# Patient Record
Sex: Male | Born: 1950
Health system: Southern US, Community
[De-identification: ages and names within clinical notes are randomized; demographics above are authoritative.]

## PROBLEM LIST (undated history)

## (undated) DIAGNOSIS — K08109 Complete loss of teeth, unspecified cause, unspecified class: Secondary | ICD-10-CM

## (undated) DIAGNOSIS — Z972 Presence of dental prosthetic device (complete) (partial): Secondary | ICD-10-CM

## (undated) DIAGNOSIS — M1990 Unspecified osteoarthritis, unspecified site: Secondary | ICD-10-CM

## (undated) DIAGNOSIS — C61 Malignant neoplasm of prostate: Secondary | ICD-10-CM

## (undated) DIAGNOSIS — Z87898 Personal history of other specified conditions: Secondary | ICD-10-CM

## (undated) DIAGNOSIS — R32 Unspecified urinary incontinence: Secondary | ICD-10-CM

## (undated) DIAGNOSIS — I2510 Atherosclerotic heart disease of native coronary artery without angina pectoris: Secondary | ICD-10-CM

## (undated) DIAGNOSIS — F17210 Nicotine dependence, cigarettes, uncomplicated: Secondary | ICD-10-CM

## (undated) DIAGNOSIS — C679 Malignant neoplasm of bladder, unspecified: Secondary | ICD-10-CM

## (undated) DIAGNOSIS — Z87820 Personal history of traumatic brain injury: Secondary | ICD-10-CM

## (undated) HISTORY — PX: CARDIAC CATHETERIZATION: SHX172

---

## 2000-09-23 ENCOUNTER — Inpatient Hospital Stay (HOSPITAL_COMMUNITY): Admission: EM | Admit: 2000-09-23 | Discharge: 2000-09-25 | Payer: Self-pay | Admitting: Emergency Medicine

## 2000-09-23 ENCOUNTER — Encounter: Payer: Self-pay | Admitting: Emergency Medicine

## 2000-10-04 ENCOUNTER — Encounter: Admission: RE | Admit: 2000-10-04 | Discharge: 2000-10-04 | Payer: Self-pay | Admitting: Internal Medicine

## 2000-11-09 ENCOUNTER — Ambulatory Visit (HOSPITAL_COMMUNITY): Admission: RE | Admit: 2000-11-09 | Discharge: 2000-11-09 | Payer: Self-pay | Admitting: Gastroenterology

## 2004-10-22 ENCOUNTER — Emergency Department (HOSPITAL_COMMUNITY): Admission: EM | Admit: 2004-10-22 | Discharge: 2004-10-22 | Payer: Self-pay | Admitting: Emergency Medicine

## 2008-11-30 ENCOUNTER — Ambulatory Visit: Payer: Self-pay | Admitting: Internal Medicine

## 2011-01-27 NOTE — Procedures (Signed)
[place]. [facility]  Patient:    Aaron Strong, Aaron Strong                         MRN: [id number] Proc. Date: 11/09/00 Attending:  Verlin Grills, M.D.                           Procedure Report  PROCEDURE:  Esophagogastroduodenoscopy.  REFERRING PHYSICIAN:  Dr. Susie Cassette, internal medicine outpatient clinic, [facility], [address], [place], [place] [address].  PROCEDURE INDICATION:  Mr. Aaron Strong (date of birth June 09, 1951) is a 60 year old male with atypical chest pain.  His cardiac catheterization performed September 25, 2000, was unremarkable.  Aaron Strong was started on Protonix and has had no further episodes of chest pain.  I discussed with Aaron Strong the complications associated with esophagogastroduodenoscopy, including intestinal bleeding and intestinal perforation.  Aaron Strong has signed the operative permit.  ENDOSCOPIST:  Verlin Grills, M.D.  PREMEDICATION:  Fentanyl 50 mcg, Versed 10 mg.  ENDOSCOPE:  Olympus gastroscope.  DESCRIPTION OF PROCEDURE:  After obtaining informed consent, the patient was placed in the left lateral decubitus position.  I administered intravenous fentanyl and intravenous Versed to achieve conscious sedation for the procedure.  The patients blood pressure, oxygen saturation, and cardiac rhythm were monitored throughout the procedure and documented in the medical record.  The Olympus gastroscope was placed through the posterior hypopharynx into the proximal esophagus without difficulty.  The hypopharynx and larynx appeared normal.  I did not visualize the vocal cords.  Esophagoscopy:  The proximal, mid, and lower segments of the esophagus appeared normal.  The squamocolumnar junction is noted at approximately 37 cm from the incisor teeth.  Endoscopically there is no evidence for the presence of erosive esophagitis, Barretts esophagus, esophageal ulcers, or  esophageal mucosal scarring.  Gastroscopy:  Retroflex view of the gastric cardia and fundus was normal.  The gastric body, antrum, and pylorus appeared normal.  Duodenoscopy:  The duodenal bulb and descending duodenum appeared normal.  ASSESSMENT:  Normal esophagogastroduodenoscopy. DD:  11/09/00 TD:  11/09/00 Job: [id number]

## 2011-01-27 NOTE — Discharge Summary (Signed)
[place]. [facility]  Patient:    Aaron Strong, Aaron Strong                       MRN: [id number] Adm. Date:  [id number] Disc. Date: [id number] Attending:  Farley Ly Dictator:   Karlene Einstein, M.D.                           Discharge Summary  DISCHARGE DIAGNOSIS:  Chest pain, unclear etiology.  DISCHARGE MEDICATION:  Pepcid 20 mg p.o. q.h.s.  FOLLOW-UP APPOINTMENT:  Aaron Strong was instructed that the hospital internal medicine outpatient clinic would call him with a follow-up appointment sometime for the week following discharge.  PROCEDURES:  Cardiac catheterization which revealed noncritical coronary artery disease and normal left ventricular function with an ejection fraction of greater than 65%.  CONSULTATIONS:  [place] [facility], Nathen May, M.D., [facility], was consulted on September 23, 2000.  BRIEF HISTORY AND PHYSICAL:  CHIEF COMPLAINT:  Chest pain.  HISTORY OF PRESENT ILLNESS:  This is a 60 year old white male with a past medical history of questionable hypertension who presents with a week or so history of chest pain.  Aaron Strong complains of chest pain that started at the first of the year.  He complains of a substernal ache.  The only radiation he describes is bilateral hand numbness.  He states the pain is a 10/10 and sharp.  He reports the pain lasts anywhere from a few seconds to a minute and reports shortness of breath yesterday morning but not associated with the chest pain.  He also reports diaphoresis unrelated to the chest pain.  He denies nausea and vomiting.  He states nitroglycerin did improve his pain to an 8/10 and also helped to decreased chest tightness.  He did have a similar episode several years ago and was diagnosed with a bruised chest muscle.  He has unknown cholesterol status.  He denies known diabetes or hypertension.  He smokes four to five packs of cigarettes a day and has marked family medical history of  coronary artery disease.  He does report some recent exertional chest pain.  REVIEW OF SYSTEMS:  This is negative for double or blurry vision, hearing changes, syncope, bright red blood per rectum, diarrhea or constipation, dysuria, orthopnea, and PND.  PAST MEDICAL HISTORY: 1. Remote questionable history of hypertension (told by R.N. friend). 2. History of left broken clavicle.  ALLERGIES:  No known drug allergies.  MEDICATIONS:  None.  PAST SURGICAL HISTORY:  Question removal of a back cyst.  SOCIAL HISTORY:  Positive for tobacco four to five packs a day since 60 years of age. 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  He drinks approximately a 12-pack of alcohol per day.  He denies any current drug use but reports that he has used marijuana in the past.  FAMILY HISTORY:  This is significant for a mom who is alive at 72 with diabetes.  Aaron Strong father died with an unknown type of cancer at the age of 69.  He reports he has three brothers with diabetes and coronary artery disease.  One brother had his first MI at the age of 52 and has had multiple MIs since.  PHYSICAL EXAMINATION:  GENERAL APPEARANCE:  This is a pleasant white male in no acute distress.  VITAL SIGNS:  Temperature 97.3, blood pressure 174/83, on recheck blood pressure 137/70,  pulse 101 and on recheck 80, respiratory rate 20, saturations 98% on room air.  HEENT:  Pupils are equal, round and reactive to light, extraocular muscles intact, oropharynx clear without erythema or exudate.  NECK:  No cervical lymphadenopathy.  LUNGS:  Clear to auscultation bilaterally, no wheezing, rhonchi or crackles.  CARDIOVASCULAR:  Regular rate and rhythm without murmurs, rubs, or gallops. No JVD, no carotid bruits bilaterally.  ABDOMEN:  Bowel sounds are present.  Abdomen is soft, nondistended, nontender and no hepatosplenomegaly.  EXTREMITIES:  There is no edema in lower  extremities bilaterally, 2+ posterior tibial pulses bilaterally.  NEUROLOGICAL:  Cranial nerves II-XII grossly intact, no focal deficits, moves all extremities well.  RECTAL:  Heme-negative.  LABS ON ADMISSION:  CBC showed white count 12.7, hemoglobin 16.1, platelets 206, MCV 96.3, ANC 9.8. BMP showed sodium 138, potassium 4.1, chloride 105, bicarb 31, BUN 12, creatinine 1.2, glucose 106.  CK 101, MB 0.7, troponin I less than 0.01.  EKG showed normal sinus rhythm with peak T-waves in II, aVF, V3 and V4.  Chest x-ray showed no acute disease, no effusion or infiltrate.  HOSPITAL COURSE BY PROBLEMS:  #1 - ATYPICAL CHEST PAIN:  Aaron Strong did give a history of that was atypical for cardiac chest pain.  However, given his significant risk factors including large quantities of cigarette use and significant family history, the decision was made to begin treatment with aspirin, Lopressor, heparin drip, and nitroglycerin drip.  In addition, cardiology was consulted and agreed with plan to proceed with treatment; in addition, recommended cardiac catheterization.  Serial enzymes and EKGs were followed during hospitalization.  Enzymes x 3 were negative.  EKG on admission was noted to be significant for peak T-waves in II, III, aVF, V3 and V4.  EKG the following morning was negative with the exception of continued increase in T-waves in V2, V3 and V4.  After initial presentation, Aaron Strong was without chest pain or shortness of breath throughout his hospitalization.  Cardiac catheterization done on September 25, 2000, revealed noncritical coronary artery disease and good left ventricular function.  Therefore, it was felt that this pain was likely noncardiac and Aaron Strong was discharged home with a prescription for Pepcid in the event that this was in fact GI etiology.  A cholesterol panel was checked which revealed total cholesterol of 187, triglycerides 95, HDL 64 and LDL of 99.  With an LDL of  less than 100, even in this patient with coronary artery disease and tobacco abuse, a [place]  antihyperlipidemic therapy was not initiated at this time.  With these risk factors, this will likely need follow-up as an outpatient and possible initiation of therapy if cholesterol levels change.  #2 - TOBACCO/ALCOHOL ABUSE:  It is possible that both tobacco and alcohol abuse could be contributing to the present symptoms of Aaron Strong.  Care management was asked to see him and counsel him regarding community resources for both alcohol and tobacco cessation.  He was encouraged to participate in both of these activities and counseled regarding the potential damages to his health.  DISCHARGE LABS:  CBC showed white count 10.6, hemoglobin 15.0, hematocrit 14.4, platelets 215.  BMP showed sodium 140, potassium 3.9, chloride 105, bicarb 29, BUN 8, creatinine 0.8, glucose 95, calcium 9.0.  PT 13.2, INR 1.0, PTT 149.  DISPOSITION:  Aaron Strong was discharged to home in good condition.  Dictated by Susie Cassette, M.D., intern. DD:  10/02/00 TD:  10/03/00 Job: [id number]

## 2011-01-27 NOTE — Cardiovascular Report (Signed)
[place]. [facility]  Patient:    Aaron Strong, Aaron Strong                       MRN: [id number] Proc. Date: 09/25/00 Adm. Date:  [id number] Attending:  Farley Ly CC:         Nathen May, M.D., [facility]  Farley Ly, M.D.   Cardiac Catheterization  PROCEDURES PERFORMED: 1. Left heart catheterization. 2. Left ventriculogram. 3. Selective coronary angiography. 4. Perclose, right femoral artery.  DIAGNOSES: 1. Mild coronary artery disease by angiogram. 2. Normal left ventricular systolic function.  HISTORY:  Mr. Aaron Strong is a 60 year old white male with multiple cardiac risk factors who presents with substernal chest discomfort.  The patient was admitted to the hospital and subsequently ruled out for acute myocardial infarction by enzymes.  He presents now for left heart catheterization.  DESCRIPTION OF PROCEDURE:  Informed consent was obtained.  The patient was brought to the catheterization lab.  A 6-French sheath was placed in the right femoral artery.  Selective angiography was then performed using preformed 6-French Judkins catheters.  A left ventriculogram was performed using power injections of contrast.  At the termination of the case, a Perclose suture closure device was deployed in the right femoral artery until adequate hemostasis was achieved.  The patient tolerated the procedure well and was transferred to the floor in stable condition.  Findings are as follows:  FINDINGS: 1. Left main trunk:  Large caliber vessel, mild irregularities. 2. LAD:  This is a medium caliber vessel that provides 2 diagonal branches.    There is moderate diffuse disease along the LAD with a focal narrowing of    30-40% in the mid section. 3. Left circumflex artery:  Large caliber vessel, is codominant.   Provides    2 marginal branches in the mid section and 3 small marginal branches    distally.  There is diffuse disease in the left circumflex of  not greater    than 30%. 4. Right coronary artery:  This is a small caliber vessel that provides an RV    marginal branch in the mid section and a trivial posterior descending    artery distally.  There is a focal narrowing of 60% in the mid RCA prior to    the takeoff of the RV marginal branch.  The remainder of the vessel has    mild diffuse disease.  LV:  Normal end systolic and end diastolic dimensions.  Overall left ventricular function is well preserved.  Ejection fraction of greater than 65%.  No mitral regurgitation.  LV pressure is 108/5.  Aortic is 108/60.  LVEDP equals 14.  ASSESSMENT AND PLAN:  Mr. Aaron Strong is a 60 year old gentleman with noncritical coronary artery disease and normal LV function by angiogram.  Further medical management and attention to risk factor modification should be pursued in regards to his coronary disease and other causes of chest pain investigated. DD:  09/25/00 TD:  09/25/00 Job: [id number]

## 2012-04-22 ENCOUNTER — Encounter: Payer: Self-pay | Admitting: Internal Medicine

## 2012-04-22 ENCOUNTER — Ambulatory Visit (INDEPENDENT_AMBULATORY_CARE_PROVIDER_SITE_OTHER): Payer: BC Managed Care – PPO | Admitting: Internal Medicine

## 2012-04-22 VITALS — BP 160/90 | HR 88 | Temp 98.1°F | Ht 64.5 in | Wt 153.0 lb

## 2012-04-22 DIAGNOSIS — Z23 Encounter for immunization: Secondary | ICD-10-CM

## 2012-04-22 DIAGNOSIS — F1010 Alcohol abuse, uncomplicated: Secondary | ICD-10-CM

## 2012-04-22 DIAGNOSIS — L723 Sebaceous cyst: Secondary | ICD-10-CM

## 2012-04-22 MED ORDER — TETANUS-DIPHTH-ACELL PERTUSSIS 5-2.5-18.5 LF-MCG/0.5 IM SUSP: 0.5000 mL | Freq: Once | INTRAMUSCULAR | Status: DC

## 2012-04-22 NOTE — Patient Instructions (Addendum)
Clean back of neck with peroxide twice daily. Return in 48 hours for recheck. Take antibiotics as prescribed.

## 2012-04-22 NOTE — Progress Notes (Signed)
  Subjective:    Patient ID: Aaron Strong, male    DOB: December 08, 1950, 61 y.o.   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 Couple of weeks ago he noticed a nodule on the back of his neck. This past weekend it drained some pus like material. In today to have it evaluated.    Review of Systems     Objective:   Physical Exam   1.5 cm raised nodule back of neck. Not currently draining. Not red or significantly tender. After sterile prep and drape and local anesthesia with 1% Xylocaine. Area was incised with a scalpel. No drainage noted. Iodoform gauze was inserted. Patient tolerated procedure well without complications.        Assessment & Plan:  Infected sebaceous cyst  Plan: Patient placed on doxycycline 100 mg twice daily for 7 days. History turned 48 hours to have drain removed. Clean area with peroxide twice daily. Tetanus immunization update given.

## 2012-04-24 ENCOUNTER — Encounter: Payer: Self-pay | Admitting: Internal Medicine

## 2012-04-24 ENCOUNTER — Ambulatory Visit (INDEPENDENT_AMBULATORY_CARE_PROVIDER_SITE_OTHER): Payer: BC Managed Care – PPO | Admitting: Internal Medicine

## 2012-04-24 VITALS — BP 154/88 | HR 92 | Temp 99.0°F | Ht 64.5 in | Wt 153.0 lb

## 2012-04-24 DIAGNOSIS — L723 Sebaceous cyst: Secondary | ICD-10-CM

## 2012-05-11 NOTE — Patient Instructions (Addendum)
Complete course of antibiotics.  Return as needed.  

## 2012-05-11 NOTE — Progress Notes (Signed)
  Subjective:    Patient ID: Aaron Strong, male    DOB: 1951/02/03, 61 y.o.   MRN: [id number]  HPI 61 year old white male with history of alcohol abuse in today for recheck on incision and drainage of infected cyst back of neck. He's been taking care of it fairly well. Currently no evidence of infection. Iodoform gauze drain has come out. Taking antibiotics as directed.    Review of Systems     Objective:   Physical Exam wound is open but clean without significant drainage.       Assessment & Plan:  Infected cyst back of neck-healing well  Plan: If patient has recurrence, he will need to be referred to general surgeon for excision. Otherwise complete ten-day course of doxycycline.

## 2013-09-30 ENCOUNTER — Other Ambulatory Visit: Payer: No Typology Code available for payment source | Admitting: Internal Medicine

## 2013-09-30 DIAGNOSIS — Z130 Encounter for screening for diseases of the blood and blood-forming organs and certain disorders involving the immune mechanism: Secondary | ICD-10-CM

## 2013-09-30 DIAGNOSIS — Z125 Encounter for screening for malignant neoplasm of prostate: Secondary | ICD-10-CM

## 2013-09-30 DIAGNOSIS — Z0000 Encounter for general adult medical examination without abnormal findings: Secondary | ICD-10-CM

## 2013-09-30 DIAGNOSIS — Z13220 Encounter for screening for lipoid disorders: Secondary | ICD-10-CM

## 2013-09-30 LAB — CBC WITH DIFFERENTIAL/PLATELET
Basophils Absolute: 0 10*3/uL (ref 0.0–0.1)
Basophils Relative: 0 % (ref 0–1)
Eosinophils Absolute: 0 10*3/uL (ref 0.0–0.7)
Eosinophils Relative: 1 % (ref 0–5)
HCT: 49.5 % (ref 39.0–52.0)
Hemoglobin: 17.6 g/dL — ABNORMAL HIGH (ref 13.0–17.0)
Lymphocytes Relative: 23 % (ref 12–46)
Lymphs Abs: 2 10*3/uL (ref 0.7–4.0)
MCH: 33.7 pg (ref 26.0–34.0)
MCHC: 35.6 g/dL (ref 30.0–36.0)
MCV: 94.6 fL (ref 78.0–100.0)
Monocytes Absolute: 1.1 10*3/uL — ABNORMAL HIGH (ref 0.1–1.0)
Monocytes Relative: 13 % — ABNORMAL HIGH (ref 3–12)
Neutro Abs: 5.4 10*3/uL (ref 1.7–7.7)
Neutrophils Relative %: 63 % (ref 43–77)
Platelets: 275 10*3/uL (ref 150–400)
RBC: 5.23 MIL/uL (ref 4.22–5.81)
RDW: 13.7 % (ref 11.5–15.5)
WBC: 8.5 10*3/uL (ref 4.0–10.5)

## 2013-09-30 LAB — LIPID PANEL
Cholesterol: 172 mg/dL (ref 0–200)
HDL: 81 mg/dL (ref >39)
LDL Cholesterol: 79 mg/dL (ref 0–99)
Total CHOL/HDL Ratio: 2.1 Ratio
Triglycerides: 61 mg/dL (ref <150)
VLDL: 12 mg/dL (ref 0–40)

## 2013-09-30 LAB — COMPREHENSIVE METABOLIC PANEL
ALT: 10 U/L (ref 0–53)
AST: 26 U/L (ref 0–37)
Albumin: 4.4 g/dL (ref 3.5–5.2)
Alkaline Phosphatase: 95 U/L (ref 39–117)
BUN: 5 mg/dL — ABNORMAL LOW (ref 6–23)
CO2: 31 mEq/L (ref 19–32)
Calcium: 9.3 mg/dL (ref 8.4–10.5)
Chloride: 97 mEq/L (ref 96–112)
Creat: 0.6 mg/dL (ref 0.50–1.35)
Glucose, Bld: 81 mg/dL (ref 70–99)
Potassium: 4.5 mEq/L (ref 3.5–5.3)
Sodium: 135 mEq/L (ref 135–145)
Total Bilirubin: 0.6 mg/dL (ref 0.3–1.2)
Total Protein: 7.5 g/dL (ref 6.0–8.3)

## 2013-10-01 LAB — PSA: PSA: 25.86 ng/mL — ABNORMAL HIGH (ref <=4.00)

## 2013-10-07 ENCOUNTER — Ambulatory Visit
Admission: RE | Admit: 2013-10-07 | Discharge: 2013-10-07 | Disposition: A | Discharge status: Home / Self Care | Payer: No Typology Code available for payment source | Source: Ambulatory Visit | Attending: Internal Medicine | Admitting: Internal Medicine

## 2013-10-07 ENCOUNTER — Ambulatory Visit (INDEPENDENT_AMBULATORY_CARE_PROVIDER_SITE_OTHER): Payer: No Typology Code available for payment source | Admitting: Internal Medicine

## 2013-10-07 VITALS — BP 156/94 | HR 88 | Temp 98.6°F | Ht 65.5 in | Wt 143.5 lb

## 2013-10-07 DIAGNOSIS — R972 Elevated prostate specific antigen [PSA]: Secondary | ICD-10-CM

## 2013-10-07 DIAGNOSIS — Z8709 Personal history of other diseases of the respiratory system: Secondary | ICD-10-CM | POA: Insufficient documentation

## 2013-10-07 DIAGNOSIS — Z0000 Encounter for general adult medical examination without abnormal findings: Secondary | ICD-10-CM

## 2013-10-07 DIAGNOSIS — R079 Chest pain, unspecified: Secondary | ICD-10-CM

## 2013-10-07 DIAGNOSIS — IMO0001 Reserved for inherently not codable concepts without codable children: Secondary | ICD-10-CM | POA: Insufficient documentation

## 2013-10-07 DIAGNOSIS — F17200 Nicotine dependence, unspecified, uncomplicated: Secondary | ICD-10-CM | POA: Insufficient documentation

## 2013-10-07 DIAGNOSIS — Z23 Encounter for immunization: Secondary | ICD-10-CM

## 2013-10-07 LAB — POCT URINALYSIS DIPSTICK
Bilirubin, UA: NEGATIVE
Blood, UA: NEGATIVE
Glucose, UA: NEGATIVE
Ketones, UA: NEGATIVE
Leukocytes, UA: NEGATIVE
Nitrite, UA: NEGATIVE
Protein, UA: NEGATIVE
Spec Grav, UA: <=1.005
Urobilinogen, UA: NEGATIVE
pH, UA: 7

## 2013-10-07 LAB — HEMOCCULT GUIAC POC 1CARD (OFFICE): FECAL OCCULT BLD: NEGATIVE

## 2013-10-07 MED ORDER — PNEUMOCOCCAL VAC POLYVALENT 25 MCG/0.5ML IJ INJ: 0.5000 mL | INJECTION | INTRAMUSCULAR | Status: DC

## 2013-10-07 NOTE — Patient Instructions (Addendum)
Appt with urologist re elevated PSA. To have CXR.  Pneumovax given. Hemoccult x 1 negative.

## 2013-10-08 ENCOUNTER — Encounter: Payer: Self-pay | Admitting: Internal Medicine

## 2013-10-08 NOTE — Progress Notes (Signed)
Subjective:    Patient ID: Aaron Strong, male    DOB: 11-11-1950, 63 y.o.   MRN: [id number]  HPI 63 year old White male for health maintenance and they wish of medical issues. Patient has been a heavy smoker for many years. Currently smoking 2-1/2 packs per day. At one point decrease to one pack per day. Has smoked since he was 63 years old. Recently had some chest pain while raking leaves. He took a break and went back to raking leaves and chest pain did not recur. He had no sweating, nausea or vomiting with the chest pain. He consumes about 18 beers nightly. 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 He completed the 11th grade. In 1979 he was engaged but never married. Fiance broken engagement. He lives alone. He does not drive. He walks to work.   History of infected sebaceous cyst posterior neck treated here August 2013.   Family history: 2 brothers died of heart attacks at ages 62 and 106. Father died in his 79s of cancer.  Remote history of right clavicle fracture. Had CT scan of brain in 2006 after a fall which was negative for skull fracture. Last chest x-ray in 2002 showed no active disease.      Review of Systems  Constitutional: Negative.   HENT: Negative.   Eyes: Negative.   Respiratory:       History of heavy smoking  Cardiovascular: Positive for chest pain.       One episode of chest pain after raking leaves for 15 minutes. No recurrence.  Endocrine: Negative.   Genitourinary: Negative.   Allergic/Immunologic: Negative.   Neurological: Negative.   Hematological: Negative.   Psychiatric/Behavioral:   History of heavy alcohol intake       Objective:   Physical Exam  Vitals reviewed. Constitutional: He is oriented to person, place, and time. He appears well-developed and well-nourished. No distress.  HENT:  Head: Normocephalic and atraumatic.  Right Ear: External ear normal.  Left Ear: External ear normal.  Mouth/Throat: Oropharynx is clear and moist. No oropharyngeal exudate.  Eyes: Conjunctivae and EOM are normal. Pupils are equal, round, and reactive to light. Right eye exhibits no discharge. Left eye exhibits no discharge. No scleral icterus.  Neck: Normal range of motion. Neck supple. No thyromegaly present.  No JVD. No carotid bruits  Cardiovascular: Normal rate, regular rhythm, normal heart sounds and intact distal pulses.   No murmur heard. Posterior tibial pulses present. Dorsalis pedis pulses absent  Pulmonary/Chest: Effort normal and breath sounds normal. No respiratory distress. He has no wheezes. He has no rales. He exhibits no tenderness.  Abdominal: Soft. Bowel sounds are normal. He exhibits no distension and no mass. There is no tenderness. There is no rebound and no guarding.  Genitourinary:  Prostate enlarged and somewhat firm but no distinct nodules. Stool guaiac negative x1  Musculoskeletal: Normal range of motion. He exhibits no edema.  Lymphadenopathy:    He has no cervical adenopathy.  Neurological: He is alert and oriented to person, place, and time. He has normal reflexes. He displays normal reflexes.  No cranial nerve deficit. Coordination normal.  Slight tremor  Skin: Skin is warm and dry. He is not diaphoretic.  Psychiatric: He has a normal mood and affect. His behavior is normal. Judgment and thought content normal.          Assessment & Plan:  Chest pain-does not sound like cardiac chest pain in that although it occurred with exertion. It was not recurrent. He had no associated diaphoresis nausea or vomiting. EKG is within normal  limits  COPD-pulse oximetry within normal limits. Currently asymptomatic.  Elevated PSA  History of alcohol abuse  History of smoking  Plan: Needs to see a urologist regarding elevated PSA of 25.86.  No other PSA results on file.  He is asymptomatic. No symptoms of BPH. He will receive Pneumovax immunization today. We will give Zostavax vaccine in a later date. He declines influenza immunization. Hemoccult card x1 today in the office was negative. Declines colonoscopy. Chest x-ray will be obtained.

## 2013-10-17 ENCOUNTER — Ambulatory Visit: Payer: No Typology Code available for payment source | Admitting: Internal Medicine

## 2013-10-21 ENCOUNTER — Ambulatory Visit (INDEPENDENT_AMBULATORY_CARE_PROVIDER_SITE_OTHER): Payer: No Typology Code available for payment source | Admitting: Internal Medicine

## 2013-10-21 DIAGNOSIS — Z2911 Encounter for prophylactic immunotherapy for respiratory syncytial virus (RSV): Secondary | ICD-10-CM

## 2013-10-21 DIAGNOSIS — Z23 Encounter for immunization: Secondary | ICD-10-CM

## 2013-12-16 ENCOUNTER — Other Ambulatory Visit: Payer: Self-pay | Admitting: Urology

## 2013-12-16 DIAGNOSIS — C61 Malignant neoplasm of prostate: Secondary | ICD-10-CM

## 2013-12-26 ENCOUNTER — Ambulatory Visit (HOSPITAL_COMMUNITY)
Admission: RE | Admit: 2013-12-26 | Discharge: 2013-12-26 | Disposition: A | Discharge status: Home / Self Care | Payer: No Typology Code available for payment source | Source: Ambulatory Visit | Attending: Urology | Admitting: Urology

## 2013-12-26 ENCOUNTER — Encounter (HOSPITAL_COMMUNITY): Payer: No Typology Code available for payment source

## 2013-12-26 DIAGNOSIS — M899 Disorder of bone, unspecified: Secondary | ICD-10-CM | POA: Insufficient documentation

## 2013-12-26 DIAGNOSIS — M949 Disorder of cartilage, unspecified: Secondary | ICD-10-CM

## 2013-12-26 DIAGNOSIS — C61 Malignant neoplasm of prostate: Secondary | ICD-10-CM | POA: Insufficient documentation

## 2013-12-26 MED ORDER — TECHNETIUM TC 99M MEDRONATE IV KIT
26.1000 | PACK | Freq: Once | INTRAVENOUS | Status: AC | PRN
  Administered 2013-12-26: 26.1 via INTRAVENOUS

## 2014-01-14 ENCOUNTER — Other Ambulatory Visit: Payer: Self-pay | Admitting: Urology

## 2014-02-03 ENCOUNTER — Ambulatory Visit (INDEPENDENT_AMBULATORY_CARE_PROVIDER_SITE_OTHER): Payer: BC Managed Care – PPO | Admitting: Cardiology

## 2014-02-03 ENCOUNTER — Encounter: Payer: Self-pay | Admitting: Cardiology

## 2014-02-03 VITALS — BP 186/100 | HR 98 | Ht 65.5 in | Wt 146.8 lb

## 2014-02-03 DIAGNOSIS — R079 Chest pain, unspecified: Secondary | ICD-10-CM

## 2014-02-03 DIAGNOSIS — Z01818 Encounter for other preprocedural examination: Secondary | ICD-10-CM

## 2014-02-03 DIAGNOSIS — C61 Malignant neoplasm of prostate: Secondary | ICD-10-CM

## 2014-02-03 NOTE — Patient Instructions (Signed)
Your physician recommends that you continue on your current medications as directed. Please refer to the Current Medication list given to you today.  Your physician has requested that you have an echocardiogram. Echocardiography is a painless test that uses sound waves to create images of your heart. It provides your doctor with information about the size and shape of your heart and how well your heart's chambers and valves are working. This procedure takes approximately one hour. There are no restrictions for this procedure.  Your physician has requested that you have en exercise stress myoview. For further information please visit [email or web address]. Please follow instruction sheet, as given.  Follow up as needed   

## 2014-02-03 NOTE — Progress Notes (Signed)
  [place], [place], [place]  [address] Phone: [phone] Fax:  [phone]  Date:  02/03/2014   ID:  Aaron Strong, DOB 12/11/1950, MRN [id number]  PCP:  [name], MD  Cardiologist:  Fransico Him, MD     History of Present Illness: This is a 63yo WM with a history of heavy tobacco use in the past and had some CP back in January while raking leaves.  He had no associated symptoms and it resolved after he rested.  Since then he has not had any further episodes of chest pain.  He also consumes 18 beers a day.  He has a strong family history of CAD with 2 brothers died of heart attacks at ages 49 and 41.  He is now referred for cardiac clearance for urologic surgery for prostate cancer.  He walks to work every morning about 3 blocks.  Wt Readings from Last 3 Encounters:  02/03/14 146 lb 12.8 oz (66.588 kg)  10/07/13 143 lb 8 oz (65.091 kg)  04/24/12 153 lb (69.4 kg)     History reviewed. No pertinent past medical history.  No current outpatient prescriptions on file.   Current Facility-Administered Medications  Medication Dose Route Frequency Provider Last Rate Last Dose  . pneumococcal 23 valent vaccine (PNU-IMMUNE) injection 0.5 mL  0.5 mL Intramuscular Tomorrow-1000 [name], MD      . TDaP Durwin Reges) injection 0.5 mL  0.5 mL Intramuscular Once [name], MD        Allergies:   No Known Allergies  Social History:  The patient  reports that he has been smoking Cigarettes.  He has been smoking about 2.00 packs per day. He has never used smokeless tobacco. He reports that he drinks about 75.6 ounces of alcohol per week. He reports that he does not use illicit drugs.   Family History:  The patient's family history includes Heart attack in his brother, brother, and father; Heart disease in his brother, brother, and father.   ROS:  Please see the history of present illness.      All other systems reviewed and negative.   PHYSICAL EXAM: VS:  BP 186/100   Pulse 98  Ht 5' 5.5" (1.664 m)  Wt 146 lb 12.8 oz (66.588 kg)  BMI 24.05 kg/m2 Well nourished, well developed, in no acute distress HEENT: normal LUNGS:  CTA bilaterally COR:  RRR with no M/R/G ABD:  Soft, NT, ND with no masses or bruits EXT:  No C/E/E, +2 PT pulses bilaterally  EKG:  NSR with no ST changes  ASSESSMENT/PLAN: 1.  Preoperative cardiac clearance for prostate surgery  2.  Chest pain - he had one episode several months ago with exertion with no recent episodes but has multiple CRF including strong family history of CAD at early age, signfiicant tobacco and ETOH abuse. - check Stress myoview and 2D echo to assess for ischemia and LVF  Followup with me PRN pending results of studies   Signed, Fransico Him, MD 02/03/2014 3:58 PM

## 2014-02-04 ENCOUNTER — Other Ambulatory Visit: Payer: Self-pay | Admitting: *Deleted

## 2014-02-04 DIAGNOSIS — R079 Chest pain, unspecified: Secondary | ICD-10-CM

## 2014-02-05 ENCOUNTER — Ambulatory Visit (HOSPITAL_COMMUNITY): Payer: BC Managed Care – PPO

## 2014-02-05 NOTE — Patient Instructions (Signed)
Aaron Strong  02/05/2014   Your procedure is scheduled on:  02/18/2014  0830am-12noon  Report to [facility].  Follow the Signs to [place] at    0630    am  Call this number if you have problems the morning of surgery: [phone]   Remember:   Do not eat food or drink liquids after midnight.   Take these medicines the morning of surgery with A SIP OF WATER:    Do not wear jewelry,   Do not wear lotions, powders, or perfumes.  . Men may shave face and neck.  Do not bring valuables to the hospital.  Contacts, dentures or bridgework may not be worn into surgery.  Leave suitcase in the car. After surgery it may be brought to your room.  For patients admitted to the hospital, checkout time is 11:00 AM the day of  discharge.          Please read over the following fact sheets that you were given:[facility] - Preparing for Surgery Before surgery, you can play an important role.  Because skin is not sterile, your skin needs to be as free of germs as possible.  You can reduce the number of germs on your skin by washing with CHG (chlorahexidine gluconate) soap before surgery.  CHG is an antiseptic cleaner which kills germs and bonds with the skin to continue killing germs even after washing. Please DO NOT use if you have an allergy to CHG or antibacterial soaps.  If your skin becomes reddened/irritated stop using the CHG and inform your nurse when you arrive at Short Stay. Do not shave (including legs and underarms) for at least 48 hours prior to the first CHG shower.  You may shave your face/neck. Please follow these instructions carefully:  1.  Shower with CHG Soap the night before surgery and the  morning of Surgery.  2.  If you choose to wash your hair, wash your hair first as usual with your  normal  shampoo.  3.  After you shampoo, rinse your hair and body thoroughly to remove the  shampoo.                           4.  Use CHG as you would any other liquid soap.   You can apply chg directly  to the skin and wash                       Gently with a scrungie or clean washcloth.  5.  Apply the CHG Soap to your body ONLY FROM THE NECK DOWN.   Do not use on face/ open                           Wound or open sores. Avoid contact with eyes, ears mouth and genitals (private parts).                       Wash face,  Genitals (private parts) with your normal soap.             6.  Wash thoroughly, paying special attention to the area where your surgery  will be performed.  7.  Thoroughly rinse your body with warm water from the neck down.  8.  DO NOT shower/wash with your normal soap after using and rinsing off  the  CHG Soap.                9.  Pat yourself dry with a clean towel.            10.  Wear clean pajamas.            11.  Place clean sheets on your bed the night of your first shower and do not  sleep with pets. Day of Surgery : Do not apply any lotions/deodorants the morning of surgery.  Please wear clean clothes to the hospital/surgery center.  FAILURE TO FOLLOW THESE INSTRUCTIONS MAY RESULT IN THE CANCELLATION OF YOUR SURGERY PATIENT SIGNATURE_________________________________  NURSE SIGNATURE__________________________________  ________________________________________________________________________  WHAT IS A BLOOD TRANSFUSION? Blood Transfusion Information  A transfusion is the replacement of blood or some of its parts. Blood is made up of multiple cells which provide different functions.  Red blood cells carry oxygen and are used for blood loss replacement.  White blood cells fight against infection.  Platelets control bleeding.  Plasma helps clot blood.  Other blood products are available for specialized needs, such as hemophilia or other clotting disorders. BEFORE THE TRANSFUSION  Who gives blood for transfusions?   Healthy volunteers who are fully evaluated to make sure their blood is safe. This is blood bank blood. Transfusion  therapy is the safest it has ever been in the practice of medicine. Before blood is taken from a donor, a complete history is taken to make sure that person has no history of diseases nor engages in risky social behavior (examples are intravenous drug use or sexual activity with multiple partners). The donor's travel history is screened to minimize risk of transmitting infections, such as malaria. The donated blood is tested for signs of infectious diseases, such as HIV and hepatitis. The blood is then tested to be sure it is compatible with you in order to minimize the chance of a transfusion reaction. If you or a relative donates blood, this is often done in anticipation of surgery and is not appropriate for emergency situations. It takes many days to process the donated blood. RISKS AND COMPLICATIONS Although transfusion therapy is very safe and saves many lives, the main dangers of transfusion include:   Getting an infectious disease.  Developing a transfusion reaction. This is an allergic reaction to something in the blood you were given. Every precaution is taken to prevent this. The decision to have a blood transfusion has been considered carefully by your caregiver before blood is given. Blood is not given unless the benefits outweigh the risks. AFTER THE TRANSFUSION  Right after receiving a blood transfusion, you will usually feel much better and more energetic. This is especially true if your red blood cells have gotten low (anemic). The transfusion raises the level of the red blood cells which carry oxygen, and this usually causes an energy increase.  The nurse administering the transfusion will monitor you carefully for complications. HOME CARE INSTRUCTIONS  No special instructions are needed after a transfusion. You may find your energy is better. Speak with your caregiver about any limitations on activity for underlying diseases you may have. SEEK MEDICAL CARE IF:   Your condition is  not improving after your transfusion.  You develop redness or irritation at the intravenous (IV) site. SEEK IMMEDIATE MEDICAL CARE IF:  Any of the following symptoms occur over the next 12 hours:  Shaking chills.  You have a temperature by mouth above 102 F (38.9 C), not controlled by medicine.  Chest, back, or muscle pain.  People around you feel you are not acting correctly or are confused.  Shortness of breath or difficulty breathing.  Dizziness and fainting.  You get a rash or develop hives.  You have a decrease in urine output.  Your urine turns a dark color or changes to pink, red, or brown. Any of the following symptoms occur over the next 10 days:  You have a temperature by mouth above 102 F (38.9 C), not controlled by medicine.  Shortness of breath.  Weakness after normal activity.  The white part of the eye turns yellow (jaundice).  You have a decrease in the amount of urine or are urinating less often.  Your urine turns a dark color or changes to pink, red, or brown. Document Released: 08/25/2000 Document Revised: 11/20/2011 Document Reviewed: 04/13/2008 ExitCare Patient Information 2014 [place].  _______________________________________________________________________, coughing and deep breathing exercises, leg exercises

## 2014-02-09 ENCOUNTER — Encounter (HOSPITAL_COMMUNITY): Payer: Self-pay

## 2014-02-09 ENCOUNTER — Encounter (HOSPITAL_COMMUNITY)
Admission: RE | Admit: 2014-02-09 | Discharge: 2014-02-09 | Disposition: A | Discharge status: Home / Self Care | Payer: BC Managed Care – PPO | Source: Ambulatory Visit | Attending: Urology | Admitting: Urology

## 2014-02-09 ENCOUNTER — Encounter (HOSPITAL_COMMUNITY): Payer: Self-pay | Admitting: Pharmacy Technician

## 2014-02-09 DIAGNOSIS — R079 Chest pain, unspecified: Secondary | ICD-10-CM | POA: Diagnosis present

## 2014-02-09 HISTORY — DX: Unspecified osteoarthritis, unspecified site: M19.90

## 2014-02-09 LAB — CBC
HCT: 45.2 % (ref 39.0–52.0)
HEMOGLOBIN: 16.2 g/dL (ref 13.0–17.0)
MCH: 34.6 pg — ABNORMAL HIGH (ref 26.0–34.0)
MCHC: 35.8 g/dL (ref 30.0–36.0)
MCV: 96.6 fL (ref 78.0–100.0)
Platelets: 221 10*3/uL (ref 150–400)
RBC: 4.68 MIL/uL (ref 4.22–5.81)
RDW: 12.3 % (ref 11.5–15.5)
WBC: 11.1 10*3/uL — ABNORMAL HIGH (ref 4.0–10.5)

## 2014-02-09 LAB — BASIC METABOLIC PANEL
BUN: 12 mg/dL (ref 6–23)
CO2: 32 mEq/L (ref 19–32)
Calcium: 9.8 mg/dL (ref 8.4–10.5)
Chloride: 95 mEq/L — ABNORMAL LOW (ref 96–112)
Creatinine, Ser: 0.6 mg/dL (ref 0.50–1.35)
Glucose, Bld: 92 mg/dL (ref 70–99)
POTASSIUM: 4.4 meq/L (ref 3.7–5.3)
Sodium: 135 mEq/L — ABNORMAL LOW (ref 137–147)

## 2014-02-09 LAB — ABO/RH: ABO/RH(D): AB POS

## 2014-02-12 ENCOUNTER — Encounter (HOSPITAL_COMMUNITY): Payer: BC Managed Care – PPO

## 2014-02-12 ENCOUNTER — Ambulatory Visit (HOSPITAL_COMMUNITY)
Admission: RE | Admit: 2014-02-12 | Discharge: 2014-02-12 | Disposition: A | Discharge status: Home / Self Care | Payer: BC Managed Care – PPO | Source: Ambulatory Visit | Attending: Internal Medicine | Admitting: Internal Medicine

## 2014-02-12 ENCOUNTER — Encounter (HOSPITAL_COMMUNITY)
Admission: RE | Admit: 2014-02-12 | Discharge: 2014-02-12 | Disposition: A | Discharge status: Home / Self Care | Payer: BC Managed Care – PPO | Source: Ambulatory Visit | Attending: Cardiology | Admitting: Cardiology

## 2014-02-12 DIAGNOSIS — R079 Chest pain, unspecified: Secondary | ICD-10-CM | POA: Diagnosis not present

## 2014-02-12 DIAGNOSIS — I369 Nonrheumatic tricuspid valve disorder, unspecified: Secondary | ICD-10-CM

## 2014-02-12 HISTORY — PX: CARDIOVASCULAR STRESS TEST: SHX262

## 2014-02-12 HISTORY — PX: TRANSTHORACIC ECHOCARDIOGRAM: SHX275

## 2014-02-12 NOTE — Progress Notes (Signed)
*  PRELIMINARY RESULTS* Echocardiogram 2D Echocardiogram has been performed.  Elvia Collum 02/12/2014, 9:22 AM

## 2014-02-13 ENCOUNTER — Telehealth: Payer: Self-pay | Admitting: Cardiology

## 2014-02-13 ENCOUNTER — Encounter (HOSPITAL_COMMUNITY)
Admission: RE | Admit: 2014-02-13 | Discharge: 2014-02-13 | Disposition: A | Discharge status: Home / Self Care | Payer: BC Managed Care – PPO | Source: Ambulatory Visit | Attending: Cardiology | Admitting: Cardiology

## 2014-02-13 ENCOUNTER — Other Ambulatory Visit: Payer: Self-pay

## 2014-02-13 ENCOUNTER — Ambulatory Visit (HOSPITAL_COMMUNITY)
Admission: RE | Admit: 2014-02-13 | Discharge: 2014-02-13 | Disposition: A | Discharge status: Home / Self Care | Payer: BC Managed Care – PPO | Source: Ambulatory Visit | Attending: Cardiology | Admitting: Cardiology

## 2014-02-13 DIAGNOSIS — R079 Chest pain, unspecified: Secondary | ICD-10-CM | POA: Insufficient documentation

## 2014-02-13 MED ORDER — TECHNETIUM TC 99M SESTAMIBI GENERIC - CARDIOLITE
30.0000 | Freq: Once | INTRAVENOUS | Status: AC | PRN
  Administered 2014-02-13: 30 via INTRAVENOUS

## 2014-02-13 MED ORDER — REGADENOSON 0.4 MG/5ML IV SOLN
0.4000 mg | Freq: Once | INTRAVENOUS | Status: AC
  Administered 2014-02-13: 0.4 mg via INTRAVENOUS

## 2014-02-13 MED ORDER — REGADENOSON 0.4 MG/5ML IV SOLN
INTRAVENOUS | Status: AC
  Filled 2014-02-13: qty 5

## 2014-02-13 MED ORDER — TECHNETIUM TC 99M SESTAMIBI GENERIC - CARDIOLITE
10.0000 | Freq: Once | INTRAVENOUS | Status: AC | PRN
  Administered 2014-02-13: 10 via INTRAVENOUS

## 2014-02-13 NOTE — Telephone Encounter (Signed)
Waiting on nuclear stress test

## 2014-02-13 NOTE — Progress Notes (Signed)
ECHO 02/12/2014 in EPIC.

## 2014-02-13 NOTE — Telephone Encounter (Signed)
Lm for Selita that Dr. Radford Pax waiting on stress test results that were done today.  Will advise once results are back.

## 2014-02-13 NOTE — Progress Notes (Signed)
lexiscan given

## 2014-02-13 NOTE — Telephone Encounter (Signed)
New message      Need clearance for a procedure scheduled for 6-10.  Please fax note to [phone]

## 2014-02-13 NOTE — Telephone Encounter (Signed)
Lm for Aaron Strong at [facility] to let us know what procedure he is scheduled to have.  Will need to send to Dr. Radford Pax for clearance.

## 2014-02-13 NOTE — Telephone Encounter (Signed)
Aaron Strong states they faxed a form on 5/7 for Dr. Tammi Klippel to do a prostatectomy. Needs cardiac clearance.  She will fax another form to triage.

## 2014-02-13 NOTE — Progress Notes (Signed)
    Underwent lexiscan myoview  Perry Mount PA-C  [facility]

## 2014-02-16 ENCOUNTER — Telehealth: Payer: Self-pay | Admitting: Cardiology

## 2014-02-16 NOTE — Telephone Encounter (Signed)
Received request from Nurse fax box, documents faxed for surgical clearance. To: [facility] Fax number: [phone] Attention: 6.8.15/km

## 2014-02-16 NOTE — Progress Notes (Signed)
Stress test done 02/13/14- EP[facility]  ECHO- 02/12/2014 - [facility]

## 2014-02-17 NOTE — H&P (Signed)
History of Present Illness   Aaron Strong presents today with several family members to further discuss the role of robotic prostatectomy in management of his newly diagnosed adenocarcinoma of the prostate.  He is currently 63 years of age. He has been recently diagnosed with intermediate to high-risk adenocarcinoma of prostate. PSA was consistently over 20.  He was found to have a small prostate with relatively large volume Gleason 6 and Gleason 7 prostate cancer.  Staging studies were negative for gross metastatic disease. He is at very high risk for extraprostatic disease at least on a microscopic level. He has undergone several consultations with regard to treatment options and is interested in robotic prostatectomy. He has had no prior intra-abdominal surgery. He consumes 12-18 beers per day. He also smokes 3 packs of cigarettes per day. He denies any known coronary disease but has a strong family history of cardiac disease.    He denied significant voiding complaints to Dr. Diona Fanti but has a IPSS score today of 21/6. SHIM 4/25   Past Medical History Problems  1. History of arthritis (V13.4)  Surgical History Problems  1. History of No Surgical Problems  Current Meds 1. Levofloxacin 500 MG Oral Tablet; take one tab by mouth the day before procedure, take  one tab by mouth the day of procedure, take one tab by mouth the after the procedure;  Therapy: [id number] to (Last Rx:19Feb2015)  Requested for: 26Mar2015 Ordered  Allergies Medication  1. No Known Drug Allergies  Family History Problems  1. Family history of Death of family member : Mother, Father  Social History Problems  1. Alcohol use   12 beers per day 2. Caffeine use (V49.89)   1 cups of coffee, 1 tea, 1 cola per day 3. Current every day smoker (305.1)   smokes 2 ppd since 1965 4. Occupation   maintenance man 57. Single  Review of Systems Genitourinary, constitutional, skin, eye, otolaryngeal,  hematologic/lymphatic, cardiovascular, pulmonary, endocrine, musculoskeletal, gastrointestinal, neurological and psychiatric system(s) were reviewed and pertinent findings if present are noted.  Genitourinary: urinary frequency, nocturia, weak urinary stream and erectile dysfunction, but no dysuria.    Vitals Vital Signs [Data Includes: Last 1 Day]  Blood Pressure: 191 / 112 Temperature: 98.1 F Heart Rate: 106  Physical Exam Constitutional: Well nourished and well developed . No acute distress.  ENT:. The ears and nose are normal in appearance.  Neck: The appearance of the neck is normal and no neck mass is present.  Pulmonary: No respiratory distress and normal respiratory rhythm and effort.  Cardiovascular: Heart rate and rhythm are normal . No peripheral edema.  Abdomen: The abdomen is soft and nontender. No masses are palpated. No CVA tenderness. No hernias are palpable. No hepatosplenomegaly noted.  Rectal: Rectal exam demonstrates normal sphincter tone, no tenderness and no masses. The prostate has no nodularity and is not tender. The left seminal vesicle is nonpalpable. The right seminal vesicle is nonpalpable. The perineum is normal on inspection.  Genitourinary: Examination of the penis demonstrates no discharge, no masses, no lesions and a normal meatus. The scrotum is without lesions. The right epididymis is palpably normal and non-tender. The left epididymis is palpably normal and non-tender. The right testis is non-tender and without masses. The left testis is non-tender and without masses.  Lymphatics: The femoral and inguinal nodes are not enlarged or tender.  Skin: Normal skin turgor, no visible rash and no visible skin lesions.  Neuro/Psych:. Mood and affect are appropriate.  Assessment Assessed  1. Adenocarcinoma of prostate (185)  Adenocarcinoma of prostate, clinical stage TIc, Gleason 3+3 in 8/12 cores, Gleason 3+4 in 1/12 cores. PSA of 22. The patient is in a high  risk group based on his PSA and cancer volume. Prostatic volume was only 14 mL. Based on the Kattan nomogram, he has a 10% chance of having organ confined disease, 90% chance of having extracapsular extension, 16% chance of having lymph node involvement, 19% chance of having seminal vesicle involvement. Bone scan and CT scans revealed no evidence of osseous metastatic disease or evidence or intra-abdominal metastases.   Plan Adenocarcinoma of prostate  1. Follow-up Schedule Surgery Office  Follow-up will fill out green sheet in am  Status: Hold  For - Appointment  Requested for: [id number]  Discussion/Summary  The patient was counseled about the natural history of prostate cancer and the standard treatment options that are available for prostate cancer. It was explained to him how his age and life expectancy, clinical stage, Gleason score, and PSA affect his prognosis, the decision to proceed with additional staging studies, as well as how that information influences recommended treatment strategies. We discussed the roles for active surveillance, radiation therapy, surgical therapy, androgen deprivation, as well as ablative therapy options for the treatment of prostate cancer as appropriate to his individual cancer situation. We discussed the risks and benefits of these options with regard to their impact on cancer control and also in terms of potential adverse events, complications, and impact on quiality of life particularly related to urinary, bowel, and sexual function. The patient was encouraged to ask questions throughout the discussion today and all questions were answered to his stated satisfaction. In addition, the patient was provided with and/or directed to appropriate resources and literature for further education about prostate cancer and treatment options.   We discussed surgical therapy for prostate cancer including the different available surgical approaches. We discussed, in detail, the  risks and expectations of surgery with regard to cancer control, urinary control, and erectile function as well as the expected postoperative recovery process. The risks, potential complications/adverse events of radical prostatectomy as well as alternative options were explained to the patient.   We discussed surgical therapy for prostate cancer including the different available surgical approaches. We discussed, in detail, the risks and expectations of surgery with regard to cancer control, urinary control, and erectile function as well as the expected postoperative recovery process. Additional risks of surgery including but not limited to bleeding, infection, hernia formation, nerve damage, lymphocele formation, bowel/rectal injury potentially necessitating colostomy, damage to the urinary tract resulting in urine leakage, urethral stricture, and the cardiopulmonary risks such as myocardial infarction, stroke, death, venothromboembolism, etc. were explained. The risk of open surgical conversion for robotic/laparoscopic prostatectomy was also discussed.   40 minutes were spent in face to face consultation with patient today.    AmendmentI do believe that a surgical approach makes the most sense for Mister Springsteen. Given his fairly significant voiding symptoms he will probably fair worse with radiation.  He does have a significant alcohol intake and is undoubtedly an alcoholic. He will need to be monitored carefully for alcohol withdrawal hospital although I would anticipate hopefully only an overnight stay. I'm also quite concerned about his 100+-pack-year smoking history and family history of coronary disease. I believe he should have preoperative cardiac evaluation/clearance before agreeing to proceed with surgical approach. If he is deemed high risk for surgery then he may need to be considered for external  beam radiation therapy plus a seed boost with or without hormonal therapy.

## 2014-02-18 ENCOUNTER — Encounter (HOSPITAL_COMMUNITY): Payer: BC Managed Care – PPO | Admitting: Registered Nurse

## 2014-02-18 ENCOUNTER — Inpatient Hospital Stay (HOSPITAL_COMMUNITY)
Admission: RE | Admit: 2014-02-18 | Discharge: 2014-02-19 | DRG: 708 | Disposition: A | Discharge status: Home / Self Care | Payer: BC Managed Care – PPO | Source: Ambulatory Visit | Attending: Urology | Admitting: Urology

## 2014-02-18 ENCOUNTER — Encounter (HOSPITAL_COMMUNITY)
Admission: RE | Disposition: A | Discharge status: Home / Self Care | Payer: Self-pay | Source: Ambulatory Visit | Attending: Urology

## 2014-02-18 ENCOUNTER — Inpatient Hospital Stay (HOSPITAL_COMMUNITY): Payer: BC Managed Care – PPO | Admitting: Registered Nurse

## 2014-02-18 ENCOUNTER — Encounter (HOSPITAL_COMMUNITY): Payer: Self-pay

## 2014-02-18 DIAGNOSIS — Z8249 Family history of ischemic heart disease and other diseases of the circulatory system: Secondary | ICD-10-CM

## 2014-02-18 DIAGNOSIS — F17200 Nicotine dependence, unspecified, uncomplicated: Secondary | ICD-10-CM | POA: Diagnosis present

## 2014-02-18 DIAGNOSIS — C61 Malignant neoplasm of prostate: Principal | ICD-10-CM | POA: Diagnosis present

## 2014-02-18 DIAGNOSIS — F1020 Alcohol dependence, uncomplicated: Secondary | ICD-10-CM | POA: Diagnosis present

## 2014-02-18 HISTORY — PX: LYMPHADENECTOMY: SHX5960

## 2014-02-18 HISTORY — PX: ROBOT ASSISTED LAPAROSCOPIC RADICAL PROSTATECTOMY: SHX5141

## 2014-02-18 LAB — TYPE AND SCREEN
ABO/RH(D): AB POS
Antibody Screen: NEGATIVE

## 2014-02-18 LAB — HEMOGLOBIN AND HEMATOCRIT, BLOOD
HCT: 46.5 % (ref 39.0–52.0)
Hemoglobin: 16.4 g/dL (ref 13.0–17.0)

## 2014-02-18 SURGERY — ROBOTIC ASSISTED LAPAROSCOPIC RADICAL PROSTATECTOMY
Anesthesia: General

## 2014-02-18 MED ORDER — FENTANYL CITRATE 0.05 MG/ML IJ SOLN
INTRAMUSCULAR | Status: DC | PRN
  Administered 2014-02-18: 50 ug via INTRAVENOUS
  Administered 2014-02-18: 100 ug via INTRAVENOUS
  Administered 2014-02-18: 50 ug via INTRAVENOUS
  Administered 2014-02-18: 100 ug via INTRAVENOUS

## 2014-02-18 MED ORDER — HYDROMORPHONE HCL PF 1 MG/ML IJ SOLN: 0.2500 mg | INTRAMUSCULAR | Status: DC | PRN

## 2014-02-18 MED ORDER — SODIUM CHLORIDE 0.9 % IV BOLUS (SEPSIS)
1000.0000 mL | Freq: Once | INTRAVENOUS | Status: AC
  Administered 2014-02-18: 1000 mL via INTRAVENOUS

## 2014-02-18 MED ORDER — SODIUM CHLORIDE 0.9 % IR SOLN
Status: DC | PRN
  Administered 2014-02-18: 1000 mL via INTRAVESICAL

## 2014-02-18 MED ORDER — MIDAZOLAM HCL 5 MG/5ML IJ SOLN
INTRAMUSCULAR | Status: DC | PRN
  Administered 2014-02-18 (×2): 2 mg via INTRAVENOUS

## 2014-02-18 MED ORDER — HYDRALAZINE HCL 20 MG/ML IJ SOLN
INTRAMUSCULAR | Status: DC | PRN
  Administered 2014-02-18 (×2): 5 mg via INTRAVENOUS

## 2014-02-18 MED ORDER — ONDANSETRON HCL 4 MG/2ML IJ SOLN
INTRAMUSCULAR | Status: AC
  Filled 2014-02-18: qty 2

## 2014-02-18 MED ORDER — SODIUM CHLORIDE 0.9 % IJ SOLN
INTRAMUSCULAR | Status: AC
  Filled 2014-02-18: qty 10

## 2014-02-18 MED ORDER — BUPIVACAINE-EPINEPHRINE 0.25% -1:200000 IJ SOLN
INTRAMUSCULAR | Status: DC | PRN
  Administered 2014-02-18: 26 mL

## 2014-02-18 MED ORDER — PROPOFOL 10 MG/ML IV BOLUS
INTRAVENOUS | Status: AC
  Filled 2014-02-18: qty 20

## 2014-02-18 MED ORDER — MIDAZOLAM HCL 2 MG/2ML IJ SOLN
INTRAMUSCULAR | Status: AC
  Filled 2014-02-18: qty 2

## 2014-02-18 MED ORDER — ATROPINE SULFATE 0.4 MG/ML IJ SOLN
INTRAMUSCULAR | Status: AC
  Filled 2014-02-18: qty 2

## 2014-02-18 MED ORDER — MORPHINE SULFATE 2 MG/ML IJ SOLN: 2.0000 mg | INTRAMUSCULAR | Status: DC | PRN

## 2014-02-18 MED ORDER — PROPOFOL 10 MG/ML IV BOLUS
INTRAVENOUS | Status: DC | PRN
  Administered 2014-02-18: 150 mg via INTRAVENOUS

## 2014-02-18 MED ORDER — PROMETHAZINE HCL 25 MG/ML IJ SOLN: 6.2500 mg | INTRAMUSCULAR | Status: DC | PRN

## 2014-02-18 MED ORDER — LACTATED RINGERS IV SOLN
INTRAVENOUS | Status: DC | PRN
  Administered 2014-02-18: 10:00:00

## 2014-02-18 MED ORDER — HYDROCODONE-ACETAMINOPHEN 5-325 MG PO TABS: 1.0000 | ORAL_TABLET | ORAL | Status: DC | PRN

## 2014-02-18 MED ORDER — HEPARIN SODIUM (PORCINE) 1000 UNIT/ML IJ SOLN
INTRAMUSCULAR | Status: AC
  Filled 2014-02-18: qty 1

## 2014-02-18 MED ORDER — CIPROFLOXACIN HCL 500 MG PO TABS: 500.0000 mg | ORAL_TABLET | Freq: Two times a day (BID) | ORAL | Status: DC

## 2014-02-18 MED ORDER — VITAMIN B-1 100 MG PO TABS
100.0000 mg | ORAL_TABLET | Freq: Every day | ORAL | Status: DC
  Administered 2014-02-19: 100 mg via ORAL
  Filled 2014-02-18 (×2): qty 1

## 2014-02-18 MED ORDER — LORAZEPAM 2 MG/ML IJ SOLN
1.0000 mg | Freq: Four times a day (QID) | INTRAMUSCULAR | Status: DC | PRN
  Administered 2014-02-18: 1 mg via INTRAVENOUS
  Filled 2014-02-18: qty 1

## 2014-02-18 MED ORDER — ROCURONIUM BROMIDE 100 MG/10ML IV SOLN
INTRAVENOUS | Status: DC | PRN
  Administered 2014-02-18: 5 mg via INTRAVENOUS
  Administered 2014-02-18: 50 mg via INTRAVENOUS
  Administered 2014-02-18: 10 mg via INTRAVENOUS

## 2014-02-18 MED ORDER — HYDROMORPHONE HCL PF 1 MG/ML IJ SOLN
INTRAMUSCULAR | Status: DC | PRN
  Administered 2014-02-18 (×4): 0.5 mg via INTRAVENOUS

## 2014-02-18 MED ORDER — LACTATED RINGERS IV SOLN
INTRAVENOUS | Status: DC | PRN
  Administered 2014-02-18: 08:00:00 via INTRAVENOUS

## 2014-02-18 MED ORDER — OXYCODONE HCL 5 MG/5ML PO SOLN
5.0000 mg | Freq: Once | ORAL | Status: DC | PRN
  Filled 2014-02-18: qty 5

## 2014-02-18 MED ORDER — EPHEDRINE SULFATE 50 MG/ML IJ SOLN
INTRAMUSCULAR | Status: AC
  Filled 2014-02-18: qty 1

## 2014-02-18 MED ORDER — GLYCOPYRROLATE 0.2 MG/ML IJ SOLN
INTRAMUSCULAR | Status: AC
  Filled 2014-02-18: qty 2

## 2014-02-18 MED ORDER — LABETALOL HCL 5 MG/ML IV SOLN
INTRAVENOUS | Status: DC | PRN
Start: 2014-02-18 — End: 2014-02-18
  Administered 2014-02-18: 5 mg via INTRAVENOUS

## 2014-02-18 MED ORDER — GLYCOPYRROLATE 0.2 MG/ML IJ SOLN
INTRAMUSCULAR | Status: DC | PRN
  Administered 2014-02-18: .4 mg via INTRAVENOUS

## 2014-02-18 MED ORDER — MEPERIDINE HCL 50 MG/ML IJ SOLN
6.2500 mg | INTRAMUSCULAR | Status: DC | PRN
Start: 2014-02-18 — End: 2014-02-18

## 2014-02-18 MED ORDER — ADULT MULTIVITAMIN W/MINERALS CH
1.0000 | ORAL_TABLET | Freq: Every day | ORAL | Status: DC
  Administered 2014-02-19: 1 via ORAL
  Filled 2014-02-18 (×2): qty 1

## 2014-02-18 MED ORDER — FENTANYL CITRATE 0.05 MG/ML IJ SOLN
INTRAMUSCULAR | Status: AC
  Filled 2014-02-18: qty 2

## 2014-02-18 MED ORDER — HYDROCODONE-ACETAMINOPHEN 5-325 MG PO TABS: 1.0000 | ORAL_TABLET | Freq: Four times a day (QID) | ORAL | Status: DC | PRN

## 2014-02-18 MED ORDER — KETOROLAC TROMETHAMINE 30 MG/ML IJ SOLN
30.0000 mg | Freq: Four times a day (QID) | INTRAMUSCULAR | Status: DC
  Administered 2014-02-18 – 2014-02-19 (×4): 30 mg via INTRAVENOUS
  Filled 2014-02-18: qty 1
  Filled 2014-02-18: qty 2
  Filled 2014-02-18: qty 1
  Filled 2014-02-18: qty 2
  Filled 2014-02-18 (×3): qty 1
  Filled 2014-02-18: qty 2

## 2014-02-18 MED ORDER — LORAZEPAM 1 MG PO TABS: 1.0000 mg | ORAL_TABLET | Freq: Four times a day (QID) | ORAL | Status: DC | PRN

## 2014-02-18 MED ORDER — CEFAZOLIN SODIUM-DEXTROSE 2-3 GM-% IV SOLR
INTRAVENOUS | Status: AC
  Filled 2014-02-18: qty 50

## 2014-02-18 MED ORDER — THIAMINE HCL 100 MG/ML IJ SOLN
100.0000 mg | Freq: Every day | INTRAMUSCULAR | Status: DC
  Filled 2014-02-18 (×2): qty 1

## 2014-02-18 MED ORDER — LIDOCAINE HCL (CARDIAC) 20 MG/ML IV SOLN
INTRAVENOUS | Status: AC
  Filled 2014-02-18: qty 5

## 2014-02-18 MED ORDER — HYDROMORPHONE HCL PF 2 MG/ML IJ SOLN
INTRAMUSCULAR | Status: AC
  Filled 2014-02-18: qty 1

## 2014-02-18 MED ORDER — BUPIVACAINE-EPINEPHRINE (PF) 0.25% -1:200000 IJ SOLN
INTRAMUSCULAR | Status: AC
  Filled 2014-02-18: qty 30

## 2014-02-18 MED ORDER — LABETALOL HCL 5 MG/ML IV SOLN
INTRAVENOUS | Status: AC
  Filled 2014-02-18: qty 4

## 2014-02-18 MED ORDER — NEOSTIGMINE METHYLSULFATE 10 MG/10ML IV SOLN
INTRAVENOUS | Status: DC | PRN
  Administered 2014-02-18: 3 mg via INTRAVENOUS

## 2014-02-18 MED ORDER — LIDOCAINE HCL (CARDIAC) 20 MG/ML IV SOLN
INTRAVENOUS | Status: DC | PRN
  Administered 2014-02-18: 8 mg via INTRAVENOUS

## 2014-02-18 MED ORDER — KETOROLAC TROMETHAMINE 30 MG/ML IJ SOLN
INTRAMUSCULAR | Status: AC
Start: 2014-02-18 — End: 2014-02-18
  Filled 2014-02-18: qty 1

## 2014-02-18 MED ORDER — OXYCODONE HCL 5 MG PO TABS: 5.0000 mg | ORAL_TABLET | Freq: Once | ORAL | Status: DC | PRN

## 2014-02-18 MED ORDER — STERILE WATER FOR IRRIGATION IR SOLN
Status: DC | PRN
Start: 2014-02-18 — End: 2014-02-18
  Administered 2014-02-18: 3000 mL

## 2014-02-18 MED ORDER — CEFAZOLIN SODIUM-DEXTROSE 2-3 GM-% IV SOLR
2.0000 g | INTRAVENOUS | Status: AC
  Administered 2014-02-18: 2 g via INTRAVENOUS

## 2014-02-18 MED ORDER — ACETAMINOPHEN 325 MG PO TABS: 650.0000 mg | ORAL_TABLET | ORAL | Status: DC | PRN

## 2014-02-18 MED ORDER — HYDRALAZINE HCL 20 MG/ML IJ SOLN
INTRAMUSCULAR | Status: AC
  Filled 2014-02-18: qty 1

## 2014-02-18 MED ORDER — DEXTROSE-NACL 5-0.45 % IV SOLN
INTRAVENOUS | Status: DC
  Administered 2014-02-18 – 2014-02-19 (×3): via INTRAVENOUS

## 2014-02-18 MED ORDER — ONDANSETRON HCL 4 MG/2ML IJ SOLN
INTRAMUSCULAR | Status: DC | PRN
  Administered 2014-02-18: 4 mg via INTRAVENOUS

## 2014-02-18 MED ORDER — FENTANYL CITRATE 0.05 MG/ML IJ SOLN
INTRAMUSCULAR | Status: AC
  Filled 2014-02-18: qty 5

## 2014-02-18 MED ORDER — FOLIC ACID 1 MG PO TABS
1.0000 mg | ORAL_TABLET | Freq: Every day | ORAL | Status: DC
  Administered 2014-02-19: 1 mg via ORAL
  Filled 2014-02-18 (×2): qty 1

## 2014-02-18 MED ORDER — ROCURONIUM BROMIDE 100 MG/10ML IV SOLN
INTRAVENOUS | Status: AC
  Filled 2014-02-18: qty 1

## 2014-02-18 SURGICAL SUPPLY — 47 items
APPLICATOR SURGIFLO ENDO (HEMOSTASIS) ×4 IMPLANT
CABLE HIGH FREQUENCY MONO STRZ (ELECTRODE) ×4 IMPLANT
CATH FOLEY 2WAY SLVR 18FR 30CC (CATHETERS) ×4 IMPLANT
CATH ROBINSON RED A/P 16FR (CATHETERS) ×4 IMPLANT
CATH TIEMANN FOLEY 18FR 5CC (CATHETERS) ×4 IMPLANT
CHLORAPREP W/TINT 26ML (MISCELLANEOUS) ×4 IMPLANT
CLIP LIGATING HEM O LOK PURPLE (MISCELLANEOUS) ×8 IMPLANT
CLOTH BEACON ORANGE TIMEOUT ST (SAFETY) ×4 IMPLANT
COVER SURGICAL LIGHT HANDLE (MISCELLANEOUS) ×4 IMPLANT
COVER TIP SHEARS 8 DVNC (MISCELLANEOUS) ×2 IMPLANT
COVER TIP SHEARS 8MM DA VINCI (MISCELLANEOUS) ×2
CUTTER ECHEON FLEX ENDO 45 340 (ENDOMECHANICALS) ×4 IMPLANT
DECANTER SPIKE VIAL GLASS SM (MISCELLANEOUS) IMPLANT
DRAPE SURG IRRIG POUCH 19X23 (DRAPES) ×4 IMPLANT
DRSG TEGADERM 2-3/8X2-3/4 SM (GAUZE/BANDAGES/DRESSINGS) ×16 IMPLANT
DRSG TEGADERM 4X4.75 (GAUZE/BANDAGES/DRESSINGS) ×8 IMPLANT
DRSG TEGADERM 6X8 (GAUZE/BANDAGES/DRESSINGS) ×8 IMPLANT
ELECT REM PT RETURN 9FT ADLT (ELECTROSURGICAL) ×4
ELECTRODE REM PT RTRN 9FT ADLT (ELECTROSURGICAL) ×2 IMPLANT
GAUZE SPONGE 2X2 8PLY STRL LF (GAUZE/BANDAGES/DRESSINGS) IMPLANT
GLOVE BIO SURGEON STRL SZ 6.5 (GLOVE) ×6 IMPLANT
GLOVE BIO SURGEONS STRL SZ 6.5 (GLOVE) ×2
GLOVE BIOGEL M STRL SZ7.5 (GLOVE) ×8 IMPLANT
GOWN STRL REUS W/TWL LRG LVL3 (GOWN DISPOSABLE) ×12 IMPLANT
HOLDER FOLEY CATH W/STRAP (MISCELLANEOUS) ×4 IMPLANT
IV LACTATED RINGERS 1000ML (IV SOLUTION) ×4 IMPLANT
KIT ACCESSORY DA VINCI DISP (KITS) ×2
KIT ACCESSORY DVNC DISP (KITS) ×2 IMPLANT
NDL SAFETY ECLIPSE 18X1.5 (NEEDLE) ×2 IMPLANT
NEEDLE HYPO 18GX1.5 SHARP (NEEDLE) ×2
PACK ROBOT UROLOGY CUSTOM (CUSTOM PROCEDURE TRAY) ×4 IMPLANT
RELOAD GREEN ECHELON 45 (STAPLE) ×4 IMPLANT
SEALER TISSUE G2 CVD JAW 45CM (ENDOMECHANICALS) IMPLANT
SET TUBE IRRIG SUCTION NO TIP (IRRIGATION / IRRIGATOR) ×4 IMPLANT
SOLUTION ELECTROLUBE (MISCELLANEOUS) ×4 IMPLANT
SPONGE GAUZE 2X2 STER 10/PKG (GAUZE/BANDAGES/DRESSINGS)
SURGIFLO W/THROMBIN 8M KIT (HEMOSTASIS) ×4 IMPLANT
SUT ETHILON 3 0 PS 1 (SUTURE) ×4 IMPLANT
SUT VIC AB 2-0 SH 27 (SUTURE)
SUT VIC AB 2-0 SH 27X BRD (SUTURE) IMPLANT
SUT VICRYL 0 UR6 27IN ABS (SUTURE) ×4 IMPLANT
SUT VLOC BARB 180 ABS3/0GR12 (SUTURE) ×12
SUTURE VLOC BRB 180 ABS3/0GR12 (SUTURE) ×6 IMPLANT
SYR 27GX1/2 1ML LL SAFETY (SYRINGE) ×4 IMPLANT
TOWEL OR 17X26 10 PK STRL BLUE (TOWEL DISPOSABLE) ×4 IMPLANT
TOWEL OR NON WOVEN STRL DISP B (DISPOSABLE) ×4 IMPLANT
WATER STERILE IRR 1500ML POUR (IV SOLUTION) ×8 IMPLANT

## 2014-02-18 NOTE — Progress Notes (Signed)
Patient ID: KEES IDROVO, male   DOB: 1951/09/05, 63 y.o.   MRN: [id number] Post-op note  Subjective: The patient is doing well.  No complaints.  Has not amb yet or used IS.    Objective: Vital signs in last 24 hours: Temp:  [97 F (36.1 C)-97.4 F (36.3 C)] 97.1 F (36.2 C) (06/10 1222) Pulse Rate:  [93-102] 102 (06/10 1222) Resp:  [18-20] 18 (06/10 1222) BP: (144-161)/(77-98) 144/77 mmHg (06/10 1222) SpO2:  [94 %-100 %] 94 % (06/10 1222) Weight:  [66.225 kg (146 lb)-67.7 kg (149 lb 4 oz)] 67.7 kg (149 lb 4 oz) (06/10 1222)  Intake/Output from previous day:   Intake/Output this shift: Total I/O In: 1800 [I.V.:1800] Out: 705 [Urine:500; Drains:5; Blood:200]  Physical Exam:  General: Alert and oriented. Abdomen: Soft, Nondistended. Incisions: Clean and dry. Urine: pink  Lab Results:  Recent Labs  02/18/14 1138  HGB 16.4  HCT 46.5    Assessment/Plan: POD#0   1) Continue to monitor  2) DVT prophy, clears, amb, pain control  3) Encouraged aggressive I.S  4) CIWA protocol for DTs    LOS: 0 days   Marcie Bal 02/18/2014, 2:21 PM

## 2014-02-18 NOTE — Transfer of Care (Signed)
Immediate Anesthesia Transfer of Care Note  Patient: Aaron Strong  Procedure(s) Performed: Procedure(s): ROBOTIC ASSISTED LAPAROSCOPIC RADICAL PROSTATECTOMY (N/A) PELVIC LYMPH NODE DISSECTION (Bilateral)  Patient Location: PACU  Anesthesia Type:General  Level of Consciousness: awake, alert , oriented and patient cooperative  Airway & Oxygen Therapy: Patient Spontanous Breathing and Patient connected to face mask oxygen  Post-op Assessment: Report given to PACU RN, Post -op Vital signs reviewed and stable and Patient moving all extremities  Post vital signs: Reviewed and stable  Complications: No apparent anesthesia complications

## 2014-02-18 NOTE — Anesthesia Preprocedure Evaluation (Addendum)
Anesthesia Evaluation  Patient identified by MRN, date of birth, ID band Patient awake    Reviewed: Allergy & Precautions, H&P , NPO status , Patient's Chart, lab work & pertinent test results  History of Anesthesia Complications (+) AWARENESS UNDER ANESTHESIA  Airway Mallampati: II TM Distance: >3 FB Neck ROM: Full    Dental  (+) Dental Advisory Given   Pulmonary Current Smoker,  breath sounds clear to auscultation        Cardiovascular negative cardio ROS  Rhythm:Regular Rate:Normal     Neuro/Psych negative neurological ROS  negative psych ROS   GI/Hepatic negative GI ROS, Neg liver ROS,   Endo/Other  negative endocrine ROS  Renal/GU negative Renal ROS     Musculoskeletal negative musculoskeletal ROS (+)   Abdominal   Peds  Hematology negative hematology ROS (+)   Anesthesia Other Findings   Reproductive/Obstetrics                          Anesthesia Physical Anesthesia Plan  ASA: III  Anesthesia Plan: General   Post-op Pain Management:    Induction: Intravenous  Airway Management Planned: Oral ETT  Additional Equipment:   Intra-op Plan:   Post-operative Plan: Extubation in OR  Informed Consent: I have reviewed the patients History and Physical, chart, labs and discussed the procedure including the risks, benefits and alternatives for the proposed anesthesia with the patient or authorized representative who has indicated his/her understanding and acceptance.   Dental advisory given  Plan Discussed with: CRNA  Anesthesia Plan Comments:         Anesthesia Quick Evaluation

## 2014-02-18 NOTE — Interval H&P Note (Signed)
History and Physical Interval Note:  02/18/2014 11:28 AM  Aaron Strong  has presented today for surgery, with the diagnosis of PROSTATE CANCER  The various methods of treatment have been discussed with the patient and family. After consideration of risks, benefits and other options for treatment, the patient has consented to  Procedure(s): ROBOTIC ASSISTED LAPAROSCOPIC RADICAL PROSTATECTOMY (N/A) PELVIC LYMPH NODE DISSECTION (Bilateral) as a surgical intervention .  The patient's history has been reviewed, patient examined, no change in status, stable for surgery.  I have reviewed the patient's chart and labs.  Questions were answered to the patient's satisfaction.     [name]

## 2014-02-18 NOTE — Discharge Instructions (Signed)

## 2014-02-18 NOTE — Anesthesia Postprocedure Evaluation (Signed)
Anesthesia Post Note  Patient: Aaron Strong  Procedure(s) Performed: Procedure(s) (LRB): ROBOTIC ASSISTED LAPAROSCOPIC RADICAL PROSTATECTOMY (N/A) PELVIC LYMPH NODE DISSECTION (Bilateral)  Anesthesia type: General  Patient location: PACU  Post pain: Pain level controlled  Post assessment: Post-op Vital signs reviewed  Last Vitals: BP 111/57  Pulse 106  Temp(Src) 36.7 C (Oral)  Resp 19  Ht 5\' 4"  (1.626 m)  Wt 149 lb 4 oz (67.7 kg)  BMI 25.61 kg/m2  SpO2 99%  Post vital signs: Reviewed  Level of consciousness: sedated  Complications: No apparent anesthesia complications

## 2014-02-19 ENCOUNTER — Encounter (HOSPITAL_COMMUNITY): Payer: Self-pay | Admitting: Urology

## 2014-02-19 LAB — BASIC METABOLIC PANEL
BUN: 4 mg/dL — ABNORMAL LOW (ref 6–23)
CALCIUM: 8.8 mg/dL (ref 8.4–10.5)
CO2: 28 mEq/L (ref 19–32)
Chloride: 98 mEq/L (ref 96–112)
Creatinine, Ser: 0.61 mg/dL (ref 0.50–1.35)
GFR calc Af Amer: >90 mL/min (ref >90)
GFR calc non Af Amer: >90 mL/min (ref >90)
GLUCOSE: 112 mg/dL — AB (ref 70–99)
POTASSIUM: 4.1 meq/L (ref 3.7–5.3)
SODIUM: 134 meq/L — AB (ref 137–147)

## 2014-02-19 LAB — HEMOGLOBIN AND HEMATOCRIT, BLOOD
HCT: 39.2 % (ref 39.0–52.0)
HEMATOCRIT: 39.5 % (ref 39.0–52.0)
HEMOGLOBIN: 13.4 g/dL (ref 13.0–17.0)
Hemoglobin: 13.3 g/dL (ref 13.0–17.0)

## 2014-02-19 MED ORDER — BISACODYL 10 MG RE SUPP
10.0000 mg | Freq: Once | RECTAL | Status: AC
  Administered 2014-02-19: 10 mg via RECTAL
  Filled 2014-02-19: qty 1

## 2014-02-19 NOTE — Discharge Summary (Signed)
  Date of admission: 02/18/2014  Date of discharge: 02/19/2014  Admission diagnosis: Prostate Cancer  Discharge diagnosis: Prostate Cancer  History and Physical: For full details, please see admission history and physical. Briefly, Aaron Strong is a 63 y.o. gentleman with localized prostate cancer.  After discussing management/treatment options, he elected to proceed with surgical treatment.  Hospital Course: Aaron Strong was taken to the operating room on 02/18/2014 and underwent a robotic assisted laparoscopic radical prostatectomy. He tolerated this procedure well and without complications. Postoperatively, he was able to be transferred to a regular hospital room following recovery from anesthesia.  He was able to begin ambulating the night of surgery. He remained hemodynamically stable overnight.  He had excellent urine output with appropriately minimal output from his pelvic drain and his pelvic drain was removed on POD #1.  He was transitioned to oral pain medication, tolerated a clear liquid diet, and had met all discharge criteria and was able to be discharged home later on POD#1.  Laboratory values:  Recent Labs  02/18/14 1138 02/19/14 0342 02/19/14 1108  HGB 16.4 13.3 13.4  HCT 46.5 39.5 39.2    Disposition: Home  Discharge instruction: He was instructed to be ambulatory but to refrain from heavy lifting, strenuous activity, or driving. He was instructed on urethral catheter care.  Discharge medications:     Medication List         ciprofloxacin 500 MG tablet  Commonly known as:  CIPRO  Take 1 tablet (500 mg total) by mouth 2 (two) times daily. Start day prior to office visit for Sambrano removal     HYDROcodone-acetaminophen 5-325 MG per tablet  Commonly known as:  NORCO  Take 1-2 tablets by mouth every 6 (six) hours as needed.        Followup: He will followup in 1 week for catheter removal and to discuss his surgical pathology results.

## 2014-02-19 NOTE — Op Note (Signed)
Preoperative diagnosis: Clinical stage T1c Adenocarcinoma prostate  Postoperative diagnosis: Same  Procedure: Robotic-assisted laparoscopic radical retropubic prostatectomy with bilateral pelvic lymph node dissection  Surgeon: [name], MD  Asst.: Leta Baptist, PA Anesthesia: Gen. Endotracheal  Indications: Patient was diagnosed with clinical stage TIc Adenocarcinoma the prostate. He underwent extensive consultation with regard to treatment options. The patient decided on a surgical approach. He appeared to understand the distinct advantages as well as the disadvantages of this procedure. The patient has performed a mechanical bowel prep. He has had placement of PAS compression boots and has received perioperative antibiotics. The patient's preoperative PSA was 22. Ultrasound revealed a 15 g prostate.   Technique and findings:The patient was brought to the operating room and had successful induction of general endotracheal anesthesia.the patient was placed in a low lithotomy position with careful padding of all extremities. He was secured to the operative table and placed in the steep Trendelenburg position. He was prepped and draped in usual manner. A Foye catheter was placed sterilely on the field. Camera port site was chosen 18 cm above the pubic symphysis just to the left of the umbilicus. A standard open Hassan technique was utilized. A 12 mm trocar was placed without difficulty. The camera was then inserted and no abnormalities were noted within the pelvis. The trochars were placed with direct visual guidance. This included 3 20mm robotic trochars and a 12 mm and 5 mm assist ports. Once all the ports were placed the robot was docked. The bladder was filled and the space of Retzius was developed with electrocautery dissection as well as blunt dissection. Superficial fat off the endopelvic fascia and bladder neck was removed with electrocautery scissors. The endopelvic fascia was then incised  bilaterally from base to apex. Levator musculature was swept off the apex of the prostate isolating the dorsal venous complex which was then stapled with the ETS stapling device. The anterior bladder neck was identified with the aid of the Humble balloon. This was then transected down to the Crew catheter with electrocautery scissors. The Parrow catheter was then retracted anteriorly.  We appeared to be well away from the ureteral orifices. The posterior bladder neck was then transected and the dissection carried down to the adnexal structures. The seminal vesicles and vas deferens on both sides were then individually dissected free and retracted anteriorly. The posterior plane between the rectum and prostate was then established primarily with blunt dissection.  Given the higher volume cancer, very high PSA density and poor preoperative sexual functioning we felt this patient would be better off not having I nerve spare procedure. With the prostate retracted anteriorly the vascular pedicles of the prostate were taken widely with hemoclips. The Koepke catheter was then reinserted and the anterior urethra was transected. The posterior urethra was then transected as were some rectourethralis fibers. The prostate was then removed from the pelvis. The pelvis was then copiously irrigated. Rectal insufflation was performed and there was no evidence of rectal injury.  Attention was then turned towards bilateral pelvic lymph node dissection. The obturator node packets were removed I laterally and the dissection extended towards the bifurcation of the iliac artery. The obturator nerve was identified on both sides and preserved. Hemalock clips were used for small veins and lymphatic channels. The node packets were sent for permanent analysis.  Attention was then turned towards reconstruction. The bladder neck did not require any reconstruction. The bladder neck and posterior urethra were reapproximated at the 6:00 position  utilizing a  2-0V-lock suture. The rest of the anastomosis was done with a double-armed 3-0 V-lock suture in a 360 degree manner.A new catheter was placed and bladder irrigation revealed no evidence of leakage. A Blake drain was placed through one of the robotic trochars and positioned in the retropubic space above the anastomosis. This was then secured to the skin with a nylon suture. The prostate was placed in the Endopouch retrieval bag. The 12 mm trocar site was closed with a Vicryl suture with the aid of a suture passer. Our other trochars were taken out with direct visual guidance without evidence of any bleeding. The camera port incision was extended slightly to allow for removal of the specimen and then closed with a running Vicryl suture. All port sites were infiltrated with Marcaine and then closed with surgical clips. The patient was then taken to recovery room having had no obvious complications or problems. Sponge and needle counts were correct.

## 2014-02-19 NOTE — Progress Notes (Signed)
Patient ID: Aaron Strong, male   DOB: 1951-02-11, 63 y.o.   MRN: [id number] 1 Day Post-Op Subjective: The patient is doing well.  No nausea or vomiting. Pain is adequately controlled.  He has amb without difficulty.  Still on O2 via  with no attempt to wean.  Is using IS but only getting to 500.    Objective: Vital signs in last 24 hours: Temp:  [97 F (36.1 C)-98.2 F (36.8 C)] 98.1 F (36.7 C) (06/11 0539) Pulse Rate:  [85-106] 85 (06/11 0539) Resp:  [18-19] 18 (06/11 0539) BP: (109-158)/(57-79) 131/74 mmHg (06/11 0539) SpO2:  [94 %-100 %] 98 % (06/11 0539) Weight:  [66.225 kg (146 lb)-67.7 kg (149 lb 4 oz)] 67.7 kg (149 lb 4 oz) (06/10 1222)  Intake/Output from previous day: 06/10 0701 - 06/11 0700 In: 4570.8 [P.O.:300; I.V.:4270.8] Out: 1455 [Urine:1200; Drains:55; Blood:200] Intake/Output this shift:    Physical Exam:  General: Alert and oriented. CV: RRR Lungs: Clear bilaterally. GI: Soft, Nondistended. Incisions: Clean, dry, and intact Urine: pink Extremities: Nontender, no erythema, no edema.  Lab Results:  Recent Labs  02/18/14 1138 02/19/14 0342  HGB 16.4 13.3  HCT 46.5 39.5      Assessment/Plan: POD# 1 s/p robotic prostatectomy.  1) SL IVF 2) Ambulate, Incentive spirometry; wean O2 and check sats after ambulation  3) Transition to oral pain medication 4) Dulcolax suppository 5) D/C pelvic drain 6) Recheck H/H at 11 to ensure stable 6) Plan for likely discharge later today     LOS: 1 day   YARBROUGH,Kanishk Stroebel G 02/19/2014, 7:21 AM

## 2014-02-19 NOTE — Progress Notes (Signed)
JP removed with no difficulty. Suppository given. Pt. Stated he will be living with his sister for a week when he goes home and she will be assisting with his catheter.  He asked if we could do leg bag/Bong teaching when she arrived. Checked oxygen saturation at rest, was 96% on RA.  While ambulating without oxygen, his oxygen saturation ranged from 96-98%. Will continue to monitor.

## 2014-02-19 NOTE — Progress Notes (Signed)
Educated patient and two sisters on how to perform Muckleroy care at home along with exchanging from night time bag to day bag.  Patient and both sisters did return demonstration on how to switch the bags.  Explained the importance of when to call the doctor, diet, exercise, shower, and dressing changes to JP site.  Patient and sister understood and had no further questions at this time.  Patient is ready for d/c.

## 2014-02-19 NOTE — Progress Notes (Signed)
No answer no vm

## 2014-02-23 ENCOUNTER — Encounter (HOSPITAL_COMMUNITY): Payer: BC Managed Care – PPO

## 2014-02-23 ENCOUNTER — Other Ambulatory Visit (HOSPITAL_COMMUNITY): Payer: BC Managed Care – PPO

## 2014-02-25 ENCOUNTER — Encounter: Payer: Self-pay | Admitting: General Surgery

## 2015-07-16 DIAGNOSIS — C61 Malignant neoplasm of prostate: Secondary | ICD-10-CM | POA: Diagnosis not present

## 2015-07-23 DIAGNOSIS — N393 Stress incontinence (female) (male): Secondary | ICD-10-CM | POA: Diagnosis not present

## 2015-07-23 DIAGNOSIS — Z8546 Personal history of malignant neoplasm of prostate: Secondary | ICD-10-CM | POA: Diagnosis not present

## 2015-09-17 ENCOUNTER — Other Ambulatory Visit: Payer: Commercial Managed Care - HMO | Admitting: Internal Medicine

## 2015-09-17 DIAGNOSIS — Z13220 Encounter for screening for lipoid disorders: Secondary | ICD-10-CM | POA: Diagnosis not present

## 2015-09-17 DIAGNOSIS — F1010 Alcohol abuse, uncomplicated: Secondary | ICD-10-CM | POA: Diagnosis not present

## 2015-09-17 DIAGNOSIS — Z0000 Encounter for general adult medical examination without abnormal findings: Secondary | ICD-10-CM

## 2015-09-17 DIAGNOSIS — C61 Malignant neoplasm of prostate: Secondary | ICD-10-CM

## 2015-09-17 LAB — COMPLETE METABOLIC PANEL WITH GFR
ALBUMIN: 4.1 g/dL (ref 3.6–5.1)
ALK PHOS: 126 U/L — AB (ref 40–115)
ALT: 10 U/L (ref 9–46)
AST: 18 U/L (ref 10–35)
BUN: 13 mg/dL (ref 7–25)
CALCIUM: 9.3 mg/dL (ref 8.6–10.3)
CO2: 29 mmol/L (ref 20–31)
Chloride: 100 mmol/L (ref 98–110)
Creat: 0.89 mg/dL (ref 0.70–1.25)
Glucose, Bld: 90 mg/dL (ref 65–99)
POTASSIUM: 4.5 mmol/L (ref 3.5–5.3)
Sodium: 138 mmol/L (ref 135–146)
Total Bilirubin: 0.5 mg/dL (ref 0.2–1.2)
Total Protein: 6.9 g/dL (ref 6.1–8.1)

## 2015-09-17 LAB — LIPID PANEL
Cholesterol: 181 mg/dL (ref 125–200)
HDL: 36 mg/dL — AB (ref >=40)
LDL CALC: 126 mg/dL (ref <130)
Total CHOL/HDL Ratio: 5 Ratio (ref <=5.0)
Triglycerides: 94 mg/dL (ref <150)
VLDL: 19 mg/dL (ref <30)

## 2015-09-17 LAB — CBC WITH DIFFERENTIAL/PLATELET
Basophils Absolute: 0 10*3/uL (ref 0.0–0.1)
Basophils Relative: 0 % (ref 0–1)
EOS ABS: 0.2 10*3/uL (ref 0.0–0.7)
Eosinophils Relative: 2 % (ref 0–5)
HCT: 45.1 % (ref 39.0–52.0)
HEMOGLOBIN: 15.3 g/dL (ref 13.0–17.0)
Lymphocytes Relative: 31 % (ref 12–46)
Lymphs Abs: 3.2 10*3/uL (ref 0.7–4.0)
MCH: 30.4 pg (ref 26.0–34.0)
MCHC: 33.9 g/dL (ref 30.0–36.0)
MCV: 89.5 fL (ref 78.0–100.0)
MONOS PCT: 10 % (ref 3–12)
MPV: 10.4 fL (ref 8.6–12.4)
Monocytes Absolute: 1 10*3/uL (ref 0.1–1.0)
NEUTROS PCT: 57 % (ref 43–77)
Neutro Abs: 5.8 10*3/uL (ref 1.7–7.7)
PLATELETS: 274 10*3/uL (ref 150–400)
RBC: 5.04 MIL/uL (ref 4.22–5.81)
RDW: 13.5 % (ref 11.5–15.5)
WBC: 10.2 10*3/uL (ref 4.0–10.5)

## 2015-09-18 LAB — PSA

## 2015-09-24 ENCOUNTER — Encounter: Payer: Self-pay | Admitting: Internal Medicine

## 2015-09-24 ENCOUNTER — Ambulatory Visit (INDEPENDENT_AMBULATORY_CARE_PROVIDER_SITE_OTHER): Payer: Commercial Managed Care - HMO | Admitting: Internal Medicine

## 2015-09-24 VITALS — BP 138/84 | HR 98 | Temp 98.0°F | Resp 20 | Ht 65.0 in | Wt 165.0 lb

## 2015-09-24 DIAGNOSIS — Z8709 Personal history of other diseases of the respiratory system: Secondary | ICD-10-CM | POA: Diagnosis not present

## 2015-09-24 DIAGNOSIS — Z23 Encounter for immunization: Secondary | ICD-10-CM

## 2015-09-24 DIAGNOSIS — Z0000 Encounter for general adult medical examination without abnormal findings: Secondary | ICD-10-CM | POA: Diagnosis not present

## 2015-09-24 DIAGNOSIS — Z8546 Personal history of malignant neoplasm of prostate: Secondary | ICD-10-CM

## 2015-09-24 DIAGNOSIS — Z720 Tobacco use: Secondary | ICD-10-CM

## 2015-09-24 DIAGNOSIS — K089 Disorder of teeth and supporting structures, unspecified: Secondary | ICD-10-CM

## 2015-09-24 DIAGNOSIS — F1020 Alcohol dependence, uncomplicated: Secondary | ICD-10-CM

## 2015-09-24 DIAGNOSIS — F1021 Alcohol dependence, in remission: Secondary | ICD-10-CM

## 2015-09-24 DIAGNOSIS — F17200 Nicotine dependence, unspecified, uncomplicated: Secondary | ICD-10-CM

## 2015-09-24 NOTE — Patient Instructions (Signed)
Smoking counseling given. Prevnar 13 given. Return in one year or as needed. Follow-up with urologist in May. Lab work is within normal limits.

## 2015-09-24 NOTE — Progress Notes (Addendum)
Subjective:    Patient ID: Aaron Strong, male    DOB: 17-Jan-1951, 65 y.o.   MRN: [id number]  HPI 65 year old White Male with history of prostate cancer status post robotic prostatectomy by Dr. [name] today for Welcome to Medicare physical exam. Prior to his prostate surgery, he was an alcoholic, drinking multiple beers daily, but after surgery, he quit drinking. Says he has not resumed drinking. Has smoke 2 to 2.5 packs per day and has smoked for many years. Not interested in quiting smoking. But is not smoking as much over the past year or so maybe a pack and a half a day. Has smoked since he was 65 years old.  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 He completed the 11th grade. He became engaged 11 but never married. Fiance broke finger age but. He lives alone. He does not drive. He walks to work.  History of infected sebaceous cyst posterior neck treated here with incision and drainage August 2013. Remote history of right clavicle fracture. Had CT scan of the brain in 2006 after a fall which was negative for skull fracture. Evaluated for chest pain prior to prostate cancer surgery in 2015. Had negative 2-D echocardiogram with normal LV function and negative stress Myoview study. Chest x-ray negative January 2015.  Family history: 2 brothers died of heart attacks at ages 21 and 45. Father died in his 50s of cancer. Mother died at age 74 of "old age ". 3 sisters.     Review of Systems no new complaints     Objective:   Physical Exam  Constitutional: He is oriented to person, place, and time. He  appears well-developed and well-nourished. No distress.  HENT:  Head: Normocephalic and atraumatic.  Right Ear: External ear normal.  Left Ear: External ear normal.  Mouth/Throat: Oropharynx is clear and moist. No oropharyngeal exudate.  Poor dentition  Eyes: Conjunctivae and EOM are normal. Pupils are equal, round, and reactive to light. Right eye exhibits no discharge. Left eye exhibits no discharge. No scleral icterus.  Neck: Neck supple. No tracheal deviation present. No thyromegaly present.  Cardiovascular: Normal rate, regular rhythm, normal heart sounds and intact distal pulses.   No murmur heard. Pulmonary/Chest: Effort normal and breath sounds normal. No respiratory distress. He has no wheezes. He has no rales. He exhibits no tenderness.  Abdominal: Soft. Bowel sounds are normal. He exhibits no distension and no mass. There is no tenderness. There is no rebound and no guarding.  Genitourinary:  Deferred to Dr. [name]  Musculoskeletal: He exhibits no edema.  Lymphadenopathy:    He has no cervical adenopathy.  Neurological: He is alert and oriented to person, place, and time. He has normal reflexes. He displays normal reflexes. No cranial nerve deficit. Coordination normal.  Skin: Skin is warm and dry. No rash noted. He is not diaphoretic.  Psychiatric: He has a normal mood and affect. His behavior is normal. Judgment and thought content normal.  Vitals reviewed.         Assessment & Plan:  History of prostate  cancer-doing well status post robotic prostatectomy surgery per Dr. [name]  COPD  Poor dentition  History of alcohol abuse-now recovering alcohol  Heavy smoking  Plan: Return in one year or as needed. Not ready to quit smoking.  Subjective:   Patient presents for Medicare Annual/Subsequent preventive examination.  Review Past Medical/Family/Social: See above   Risk Factors  Current exercise habits: Just with work Dietary issues discussed: Low fat low  carbohydrate discussed  Cardiac risk factors:  Depression Screen  (Note: if answer to either of the following is "Yes", a more complete depression screening is indicated)   Over the past two weeks, have you felt down, depressed or hopeless? No  Over the past two weeks, have you felt little interest or pleasure in doing things? No Have you lost interest or pleasure in daily life? No Do you often feel hopeless? No Do you cry easily over simple problems? No   Activities of Daily Living  In your present state of health, do you have any difficulty performing the following activities?:   Driving? No  Managing money? No  Feeding yourself? No  Getting from bed to chair? No  Climbing a flight of stairs? No  Preparing food and eating?: No  Bathing or showering? No  Getting dressed: No  Getting to the toilet? No  Using the toilet:No  Moving around from place to place: No  In the past year have you fallen or had a near fall?:No  Are you sexually active? No  Do you have more than one partner? No   Hearing Difficulties: No  Do you often ask people to speak up or repeat themselves? No  Do you experience ringing or noises in your ears? No  Do you have difficulty understanding soft or whispered voices? No  Do you feel that you have a problem with memory? No Do you often misplace items? No    Home Safety:  Do you have a smoke alarm at your residence? Yes Do you have grab bars in the bathroom? No Do you have throw rugs in your house? No   Cognitive Testing  Alert? Yes Normal Appearance?Yes  Oriented to person? Yes Place? Yes  Time? Yes  Recall of three objects? Yes  Can perform simple calculations? Yes  Displays appropriate judgment?Yes  Can read the correct time from a watch face?Yes   List the Names of Other Physician/Practitioners you currently use:  See referral list for the physicians patient is currently seeing.  Dr. [name]   Review of Systems: See above  Objective:       General appearance: Appears older than stated age Head: Normocephalic, without obvious abnormality, atraumatic  Eyes: conj clear, EOMi PEERLA  Ears: normal TM's and external ear canals both ears  Nose: Nares normal. Septum midline. Mucosa normal. No drainage or sinus tenderness.  Throat: lips, mucosa, and tongue normal; poor dentition Neck: no adenopathy, no carotid bruit, no JVD, supple, symmetrical, trachea midline and thyroid not enlarged, symmetric, no tenderness/mass/nodules  No CVA tenderness.  Lungs: clear to auscultation bilaterally  Breasts: normal appearance, no masses or tenderness Heart: regular rate and rhythm, S1, S2 normal, no murmur, click, rub or gallop  Abdomen: soft, non-tender; bowel sounds normal; no masses, no organomegaly  Musculoskeletal: ROM normal in all joints, no crepitus, no deformity, Normal muscle strengthen. Back  is symmetric, no curvature. Skin: Skin color, texture, turgor normal. No rashes or lesions  Lymph nodes: Cervical, supraclavicular, and axillary nodes normal.  Neurologic: CN 2 -12  Normal, Normal symmetric reflexes. Normal coordination and gait  Psych: Alert & Oriented x 3, Mood appear stable.    Assessment:    Annual wellness medicare exam   Plan:    During the course of the visit the patient was educated and counseled about appropriate screening and preventive services including:   PSA will be faxed to Dr. [name]     Patient Instructions (the written plan) was given to the patient.  Medicare Attestation  I have personally reviewed:  The patient's medical and social history  Their use of alcohol, tobacco or illicit drugs  Their current medications and supplements  The patient's functional ability including ADLs,fall risks, home safety risks, cognitive, and hearing and visual impairment  Diet and physical activities  Evidence for depression or mood disorders  The patient's weight, height, BMI, and visual acuity have been  recorded in the chart. I have made referrals, counseling, and provided education to the patient based on review of the above and I have provided the patient with a written personalized care plan for preventive services.

## 2015-10-03 DIAGNOSIS — F1021 Alcohol dependence, in remission: Secondary | ICD-10-CM | POA: Insufficient documentation

## 2016-01-14 DIAGNOSIS — Z8546 Personal history of malignant neoplasm of prostate: Secondary | ICD-10-CM | POA: Diagnosis not present

## 2016-01-21 DIAGNOSIS — Z0000 Encounter for general adult medical examination without abnormal findings: Secondary | ICD-10-CM | POA: Diagnosis not present

## 2016-01-21 DIAGNOSIS — Z8546 Personal history of malignant neoplasm of prostate: Secondary | ICD-10-CM | POA: Diagnosis not present

## 2016-07-21 DIAGNOSIS — Z8546 Personal history of malignant neoplasm of prostate: Secondary | ICD-10-CM | POA: Diagnosis not present

## 2016-07-28 DIAGNOSIS — N393 Stress incontinence (female) (male): Secondary | ICD-10-CM | POA: Diagnosis not present

## 2016-07-28 DIAGNOSIS — Z8546 Personal history of malignant neoplasm of prostate: Secondary | ICD-10-CM | POA: Diagnosis not present

## 2016-07-31 ENCOUNTER — Ambulatory Visit: Payer: Commercial Managed Care - HMO | Admitting: Internal Medicine

## 2016-09-22 ENCOUNTER — Other Ambulatory Visit: Payer: Commercial Managed Care - HMO | Admitting: Internal Medicine

## 2016-09-26 ENCOUNTER — Other Ambulatory Visit: Payer: Medicare HMO | Admitting: Internal Medicine

## 2016-09-26 ENCOUNTER — Other Ambulatory Visit: Payer: Commercial Managed Care - HMO | Admitting: Internal Medicine

## 2016-09-26 DIAGNOSIS — Z0000 Encounter for general adult medical examination without abnormal findings: Secondary | ICD-10-CM

## 2016-09-26 DIAGNOSIS — Z13220 Encounter for screening for lipoid disorders: Secondary | ICD-10-CM | POA: Diagnosis not present

## 2016-09-26 DIAGNOSIS — Z8546 Personal history of malignant neoplasm of prostate: Secondary | ICD-10-CM

## 2016-09-26 LAB — CBC WITH DIFFERENTIAL/PLATELET
Basophils Absolute: 0 cells/uL (ref 0–200)
Basophils Relative: 0 %
EOS ABS: 124 {cells}/uL (ref 15–500)
Eosinophils Relative: 1 %
HEMATOCRIT: 44.2 % (ref 38.5–50.0)
HEMOGLOBIN: 14.8 g/dL (ref 13.2–17.1)
LYMPHS ABS: 2976 {cells}/uL (ref 850–3900)
LYMPHS PCT: 24 %
MCH: 30.3 pg (ref 27.0–33.0)
MCHC: 33.5 g/dL (ref 32.0–36.0)
MCV: 90.6 fL (ref 80.0–100.0)
MONO ABS: 1612 {cells}/uL — AB (ref 200–950)
MPV: 9.6 fL (ref 7.5–12.5)
Monocytes Relative: 13 %
NEUTROS PCT: 62 %
Neutro Abs: 7688 cells/uL (ref 1500–7800)
Platelets: 348 10*3/uL (ref 140–400)
RBC: 4.88 MIL/uL (ref 4.20–5.80)
RDW: 13.6 % (ref 11.0–15.0)
WBC: 12.4 10*3/uL — AB (ref 3.8–10.8)

## 2016-09-26 LAB — COMPREHENSIVE METABOLIC PANEL
ALBUMIN: 4.1 g/dL (ref 3.6–5.1)
ALK PHOS: 93 U/L (ref 40–115)
ALT: 12 U/L (ref 9–46)
AST: 17 U/L (ref 10–35)
BILIRUBIN TOTAL: 0.6 mg/dL (ref 0.2–1.2)
BUN: 8 mg/dL (ref 7–25)
CO2: 30 mmol/L (ref 20–31)
Calcium: 9.2 mg/dL (ref 8.6–10.3)
Chloride: 99 mmol/L (ref 98–110)
Creat: 0.74 mg/dL (ref 0.70–1.25)
Glucose, Bld: 86 mg/dL (ref 65–99)
POTASSIUM: 4.5 mmol/L (ref 3.5–5.3)
Sodium: 136 mmol/L (ref 135–146)
Total Protein: 6.9 g/dL (ref 6.1–8.1)

## 2016-09-26 LAB — LIPID PANEL
CHOL/HDL RATIO: 4.7 ratio (ref <5.0)
Cholesterol: 165 mg/dL (ref <200)
HDL: 35 mg/dL — AB (ref >40)
LDL Cholesterol: 110 mg/dL — ABNORMAL HIGH (ref <100)
TRIGLYCERIDES: 98 mg/dL (ref <150)
VLDL: 20 mg/dL (ref <30)

## 2016-09-26 LAB — PSA: PSA: 0.1 ng/mL (ref <=4.0)

## 2016-09-26 NOTE — Progress Notes (Signed)
Labs only

## 2016-09-26 NOTE — Progress Notes (Signed)
Did not keep appt for lab work. We will need to contact him

## 2016-09-29 ENCOUNTER — Ambulatory Visit (INDEPENDENT_AMBULATORY_CARE_PROVIDER_SITE_OTHER): Payer: Medicare HMO | Admitting: Internal Medicine

## 2016-09-29 ENCOUNTER — Encounter: Payer: Self-pay | Admitting: Internal Medicine

## 2016-09-29 VITALS — BP 140/90 | HR 123 | Ht 62.5 in | Wt 174.0 lb

## 2016-09-29 DIAGNOSIS — Z0000 Encounter for general adult medical examination without abnormal findings: Secondary | ICD-10-CM

## 2016-09-29 DIAGNOSIS — J209 Acute bronchitis, unspecified: Secondary | ICD-10-CM | POA: Diagnosis not present

## 2016-09-29 DIAGNOSIS — L309 Dermatitis, unspecified: Secondary | ICD-10-CM | POA: Diagnosis not present

## 2016-09-29 DIAGNOSIS — Z8546 Personal history of malignant neoplasm of prostate: Secondary | ICD-10-CM

## 2016-09-29 DIAGNOSIS — Z23 Encounter for immunization: Secondary | ICD-10-CM | POA: Diagnosis not present

## 2016-09-29 MED ORDER — AMOXICILLIN-POT CLAVULANATE 875-125 MG PO TABS: 1.0000 | ORAL_TABLET | Freq: Two times a day (BID) | ORAL | 0 refills | Status: DC

## 2016-09-29 NOTE — Patient Instructions (Addendum)
It was a pleasure to see you today. Take antibiotic as directed. Use inhaler. Flu vaccine given. Has hand moisturizer to use on hands. Take Augmentin twice daily for 10 days. Symbicort inhaler sample 2 sprays twice daily.
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
## 2017-01-24 DIAGNOSIS — C61 Malignant neoplasm of prostate: Secondary | ICD-10-CM | POA: Diagnosis not present

## 2017-01-31 DIAGNOSIS — Z8546 Personal history of malignant neoplasm of prostate: Secondary | ICD-10-CM | POA: Diagnosis not present

## 2017-01-31 DIAGNOSIS — N3946 Mixed incontinence: Secondary | ICD-10-CM | POA: Diagnosis not present

## 2017-07-27 DIAGNOSIS — C600 Malignant neoplasm of prepuce: Secondary | ICD-10-CM | POA: Diagnosis not present

## 2017-08-08 DIAGNOSIS — N393 Stress incontinence (female) (male): Secondary | ICD-10-CM | POA: Diagnosis not present

## 2017-08-08 DIAGNOSIS — Z8546 Personal history of malignant neoplasm of prostate: Secondary | ICD-10-CM | POA: Diagnosis not present

## 2017-10-26 DIAGNOSIS — R350 Frequency of micturition: Secondary | ICD-10-CM | POA: Diagnosis not present

## 2017-10-26 DIAGNOSIS — N3946 Mixed incontinence: Secondary | ICD-10-CM | POA: Diagnosis not present

## 2017-11-09 DIAGNOSIS — N393 Stress incontinence (female) (male): Secondary | ICD-10-CM | POA: Diagnosis not present

## 2017-11-09 DIAGNOSIS — R310 Gross hematuria: Secondary | ICD-10-CM | POA: Diagnosis not present

## 2017-11-16 DIAGNOSIS — N3946 Mixed incontinence: Secondary | ICD-10-CM | POA: Diagnosis not present

## 2017-11-16 DIAGNOSIS — N3000 Acute cystitis without hematuria: Secondary | ICD-10-CM | POA: Diagnosis not present

## 2017-11-30 DIAGNOSIS — R310 Gross hematuria: Secondary | ICD-10-CM | POA: Diagnosis not present

## 2017-11-30 DIAGNOSIS — N329 Bladder disorder, unspecified: Secondary | ICD-10-CM | POA: Diagnosis not present

## 2017-11-30 DIAGNOSIS — Z8546 Personal history of malignant neoplasm of prostate: Secondary | ICD-10-CM | POA: Diagnosis not present

## 2017-12-10 ENCOUNTER — Other Ambulatory Visit: Payer: Self-pay

## 2017-12-10 ENCOUNTER — Other Ambulatory Visit: Payer: Self-pay | Admitting: Urology

## 2017-12-10 DIAGNOSIS — Z13220 Encounter for screening for lipoid disorders: Secondary | ICD-10-CM

## 2017-12-10 DIAGNOSIS — Z0000 Encounter for general adult medical examination without abnormal findings: Secondary | ICD-10-CM

## 2017-12-10 DIAGNOSIS — Z8546 Personal history of malignant neoplasm of prostate: Secondary | ICD-10-CM

## 2017-12-25 ENCOUNTER — Other Ambulatory Visit: Payer: Medicare HMO | Admitting: Internal Medicine

## 2017-12-25 DIAGNOSIS — Z0000 Encounter for general adult medical examination without abnormal findings: Secondary | ICD-10-CM | POA: Diagnosis not present

## 2017-12-25 DIAGNOSIS — Z8546 Personal history of malignant neoplasm of prostate: Secondary | ICD-10-CM | POA: Diagnosis not present

## 2017-12-25 DIAGNOSIS — Z13220 Encounter for screening for lipoid disorders: Secondary | ICD-10-CM

## 2017-12-26 LAB — COMPLETE METABOLIC PANEL WITH GFR
AG RATIO: 1.6 (calc) (ref 1.0–2.5)
ALKALINE PHOSPHATASE (APISO): 113 U/L (ref 40–115)
ALT: 10 U/L (ref 9–46)
AST: 19 U/L (ref 10–35)
Albumin: 4.4 g/dL (ref 3.6–5.1)
BUN: 8 mg/dL (ref 7–25)
CALCIUM: 9.8 mg/dL (ref 8.6–10.3)
CO2: 38 mmol/L — ABNORMAL HIGH (ref 20–32)
Chloride: 97 mmol/L — ABNORMAL LOW (ref 98–110)
Creat: 0.79 mg/dL (ref 0.70–1.25)
GFR, EST NON AFRICAN AMERICAN: 92 mL/min/{1.73_m2} (ref >=60)
GFR, Est African American: 107 mL/min/{1.73_m2} (ref >=60)
Globulin: 2.8 g/dL (calc) (ref 1.9–3.7)
Glucose, Bld: 96 mg/dL (ref 65–99)
POTASSIUM: 5.3 mmol/L (ref 3.5–5.3)
Sodium: 139 mmol/L (ref 135–146)
Total Bilirubin: 0.5 mg/dL (ref 0.2–1.2)
Total Protein: 7.2 g/dL (ref 6.1–8.1)

## 2017-12-26 LAB — CBC WITH DIFFERENTIAL/PLATELET
Basophils Absolute: 60 cells/uL (ref 0–200)
Basophils Relative: 0.6 %
EOS PCT: 3.1 %
Eosinophils Absolute: 310 cells/uL (ref 15–500)
HCT: 48.1 % (ref 38.5–50.0)
HEMOGLOBIN: 16.5 g/dL (ref 13.2–17.1)
Lymphs Abs: 2800 cells/uL (ref 850–3900)
MCH: 30.2 pg (ref 27.0–33.0)
MCHC: 34.3 g/dL (ref 32.0–36.0)
MCV: 87.9 fL (ref 80.0–100.0)
MONOS PCT: 9.7 %
MPV: 10.3 fL (ref 7.5–12.5)
NEUTROS ABS: 5860 {cells}/uL (ref 1500–7800)
Neutrophils Relative %: 58.6 %
PLATELETS: 303 10*3/uL (ref 140–400)
RBC: 5.47 10*6/uL (ref 4.20–5.80)
RDW: 12.9 % (ref 11.0–15.0)
Total Lymphocyte: 28 %
WBC mixed population: 970 cells/uL — ABNORMAL HIGH (ref 200–950)
WBC: 10 10*3/uL (ref 3.8–10.8)

## 2017-12-26 LAB — LIPID PANEL
CHOL/HDL RATIO: 4.3 (calc) (ref <5.0)
Cholesterol: 187 mg/dL (ref <200)
HDL: 43 mg/dL (ref >40)
LDL Cholesterol (Calc): 126 mg/dL (calc) — ABNORMAL HIGH
NON-HDL CHOLESTEROL (CALC): 144 mg/dL — AB (ref <130)
Triglycerides: 84 mg/dL (ref <150)

## 2017-12-26 LAB — PSA: PSA: <0.1 ng/mL (ref <=4.0)

## 2017-12-28 ENCOUNTER — Encounter: Payer: Self-pay | Admitting: Internal Medicine

## 2018-01-03 ENCOUNTER — Ambulatory Visit (INDEPENDENT_AMBULATORY_CARE_PROVIDER_SITE_OTHER): Payer: Medicare HMO | Admitting: Internal Medicine

## 2018-01-03 ENCOUNTER — Encounter: Payer: Self-pay | Admitting: Internal Medicine

## 2018-01-03 VITALS — BP 120/80 | HR 84 | Ht 63.0 in | Wt 187.0 lb

## 2018-01-03 DIAGNOSIS — Z6833 Body mass index (BMI) 33.0-33.9, adult: Secondary | ICD-10-CM

## 2018-01-03 DIAGNOSIS — Z9079 Acquired absence of other genital organ(s): Secondary | ICD-10-CM

## 2018-01-03 DIAGNOSIS — Z8546 Personal history of malignant neoplasm of prostate: Secondary | ICD-10-CM | POA: Diagnosis not present

## 2018-01-03 DIAGNOSIS — R32 Unspecified urinary incontinence: Secondary | ICD-10-CM

## 2018-01-03 DIAGNOSIS — Z0000 Encounter for general adult medical examination without abnormal findings: Secondary | ICD-10-CM

## 2018-01-03 DIAGNOSIS — E7800 Pure hypercholesterolemia, unspecified: Secondary | ICD-10-CM

## 2018-01-03 DIAGNOSIS — Z87891 Personal history of nicotine dependence: Secondary | ICD-10-CM

## 2018-01-03 LAB — POCT URINALYSIS DIPSTICK
APPEARANCE: NORMAL
Bilirubin, UA: NEGATIVE
Glucose, UA: NEGATIVE
Ketones, UA: NEGATIVE
Leukocytes, UA: NEGATIVE
NITRITE UA: NEGATIVE
Odor: NORMAL
PH UA: 7 (ref 5.0–8.0)
PROTEIN UA: NEGATIVE
SPEC GRAV UA: 1.01 (ref 1.010–1.025)
UROBILINOGEN UA: 0.2 U/dL

## 2018-01-03 NOTE — Progress Notes (Signed)
Subjective:    Patient ID: Aaron Strong, male    DOB: 1951/06/25, 67 y.o.   MRN: [id number]  HPI   67 year old Male for Medicare Wellness exam, routine physical examination and evaluation of medical issues.  [employer] sent him a Cologuard test for free. He mailed it off last week.  Labs reviewed and are WNL except for LDL of 126 up from 110 a year ago.Orpha Bur at [facility]. Eats ice cream nightly which may be affecting lipids.  To have surgery by [name], Urologist in May for filling defect right side of bladder.  Hx old 15% inferior endplate compression fracture at L1.  Hx ventral hernia.  He does not walk as much as he used to.  He has gained 22 pounds since 2017.  Hx prostatectomy  for prostate cancer in 2015 but has residual urinary incontinence and wears pads.  Immunizations up to date.  Smokes about 1.25 ppd. Coronary atherosclerosis seen on CT urogram.  SHx: Never married .  Was engaged once in 1979 but Celesta Gentile broken engagement when he got drunk.  Used to drink alcohol heavily but quit completely just after prostatectomy. 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 Rents house and has 2 other roomates. Drives Moped.Lives near [facility] where he spends most of his work days.  Family history: 2 brothers heart attacks is 56 and 32.  Father died in his 19s of cancer.  Mother died at age 90 of "old age ".  3 sisters.  History of infected sebaceous cyst posterior neck treated here with incision and drainage August 2013.  Remote history of right clavicle fracture.  Head CT of the brain in 2006 after a fall which showed no evidence of skull fracture.  Evaluated for chest pain prior to prostate cancer surgery in 2015.  He had a normal 2D echocardiogram with normal LV function and negative stress Myoview study.  Review of Systems  Constitutional: Positive for fatigue.  Respiratory: Negative.   Cardiovascular: Negative.   Gastrointestinal: Negative.   Genitourinary:      Urinary incontinence status post prostatectomy  Musculoskeletal: Negative.   Neurological: Negative.        Objective:   Physical Exam  Constitutional: He is oriented to person, place, and time. He appears well-developed and well-nourished. No distress.  HENT:  Head: Normocephalic and atraumatic.  Right Ear: External ear normal.  Left Ear: External ear normal.  Mouth/Throat: Oropharynx is clear and moist.  Eyes: Pupils are equal, round, and reactive to light. Conjunctivae and EOM are normal. Right eye exhibits no discharge. Left eye exhibits no discharge. No scleral icterus.  Neck: Neck supple. No JVD present. No thyromegaly present.  Cardiovascular: Normal rate, regular rhythm, normal heart sounds and intact distal pulses.  No murmur heard. Pulmonary/Chest: Effort normal and breath sounds normal. No respiratory distress. He has no wheezes. He has no rales. He exhibits no tenderness.  Abdominal: Soft. Bowel sounds are normal. He exhibits no distension and no mass. There is no tenderness. There is no rebound and no guarding.  Genitourinary:  Genitourinary Comments: Not examined deferred to neurology  Musculoskeletal: He exhibits no edema.  Lymphadenopathy:    He has no cervical adenopathy.  Neurological: He is alert and oriented to person, place, and time. He has normal reflexes. No cranial nerve deficit. Coordination normal.  Skin: Skin is warm and dry. No rash noted. He is not diaphoretic.  Psychiatric: He has a normal mood and affect. His  behavior is normal. Judgment and thought content normal.  Vitals reviewed.         Assessment & Plan:  History of prostate cancer status post prostatectomy 2015 with nail concern for possible transitional bladder cell carcinoma and is to have surgery by D in May.r. Winter  History of heavy smoking-advised patient to cut back on smoking with upcoming surgery.  Deconditioning-He has gained 22 pounds since 2017 and needs to walk  more.  Elevated LDL cholesterol which would likely respond to diet exercise and weight loss  Plan: Lab work reviewed with him.  Return in 1 year or as needed.  Immunizations are up-to-date.  Subjective:   Patient presents for Medicare Annual/Subsequent preventive examination.  Review Past Medical/Family/Social: See above   Risk Factors  Current exercise habits: Does not walk as much as he used to but is fairly active with his job Dietary issues discussed: Low-fat low carbohydrate and cut back on intake of food  Cardiac risk factors: Hyperlipidemia and history of smoking as well as family history  Depression Screen  (Note: if answer to either of the following is "Yes", a more complete depression screening is indicated)   Over the past two weeks, have you felt down, depressed or hopeless? No  Over the past two weeks, have you felt little interest or pleasure in doing things? No Have you lost interest or pleasure in daily life? No Do you often feel hopeless? No Do you cry easily over simple problems? No   Activities of Daily Living  In your present state of health, do you have any difficulty performing the following activities?:   Driving? No  Managing money? No  Feeding yourself? No  Getting from bed to chair? No  Climbing a flight of stairs? No  Preparing food and eating?: No  Bathing or showering? No  Getting dressed: No  Getting to the toilet? No  Using the toilet:No  Moving around from place to place: No  In the past year have you fallen or had a near fall?:No  Are you sexually active? No  Do you have more than one partner? No   Hearing Difficulties: No  Do you often ask people to speak up or repeat themselves? No  Do you experience ringing or noises in your ears? No  Do you have difficulty understanding soft or whispered voices? No  Do you feel that you have a problem with memory? No Do you often misplace items? No    Home Safety:  Do you have a smoke alarm at  your residence? Yes Do you have grab bars in the bathroom?  No Do you have throw rugs in your house?  No   Cognitive Testing  Alert? Yes Normal Appearance?Yes  Oriented to person? Yes Place? Yes  Time? Yes  Recall of three objects? Yes  Can perform simple calculations? Yes  Displays appropriate judgment?Yes  Can read the correct time from a watch face?Yes   List the Names of Other Physician/Practitioners you currently use:  See referral list for the physicians patient is currently seeing.     Review of Systems: See above   Objective:     General appearance: Appears stated age and mildly obese  Head: Normocephalic, without obvious abnormality, atraumatic  Eyes: conj clear, EOMi PEERLA  Ears: normal TM's and external ear canals both ears  Nose: Nares normal. Septum midline. Mucosa normal. No drainage or sinus tenderness.  Throat: lips, mucosa, and tongue normal; teeth and gums normal  Neck:  no adenopathy, no carotid bruit, no JVD, supple, symmetrical, trachea midline and thyroid not enlarged, symmetric, no tenderness/mass/nodules  No CVA tenderness.  Lungs: clear to auscultation bilaterally  Breasts: normal appearance, no masses or tenderness Heart: regular rate and rhythm, S1, S2 normal, no murmur, click, rub or gallop  Abdomen: soft, non-tender; bowel sounds normal; no masses, no organomegaly  Musculoskeletal: ROM normal in all joints, no crepitus, no deformity, Normal muscle strengthen. Back  is symmetric, no curvature. Skin: Skin color, texture, turgor normal. No rashes or lesions  Lymph nodes: Cervical, supraclavicular, and axillary nodes normal.  Neurologic: CN 2 -12 Normal, Normal symmetric reflexes. Normal coordination and gait  Psych: Alert & Oriented x 3, Mood appear stable.    Assessment:    Annual wellness medicare exam   Plan:    During the course of the visit the patient was educated and counseled about appropriate screening and preventive services  including:   Annual flu vaccine     Patient Instructions (the written plan) was given to the patient.  Medicare Attestation  I have personally reviewed:  The patient's medical and social history  Their use of alcohol, tobacco or illicit drugs  Their current medications and supplements  The patient's functional ability including ADLs,fall risks, home safety risks, cognitive, and hearing and visual impairment  Diet and physical activities  Evidence for depression or mood disorders  The patient's weight, height, BMI, and visual acuity have been recorded in the chart. I have made referrals, counseling, and provided education to the patient based on review of the above and I have provided the patient with a written personalized care plan for preventive services.

## 2018-01-03 NOTE — Patient Instructions (Addendum)
Please try to cut back on smoking in anticipation of upcoming surgery.  Try to walk some.  Try to lose some weight.  Return in 1 year or as needed.  It was a pleasure to see you today.

## 2018-01-08 ENCOUNTER — Encounter (HOSPITAL_COMMUNITY): Payer: Self-pay

## 2018-01-10 ENCOUNTER — Encounter (HOSPITAL_BASED_OUTPATIENT_CLINIC_OR_DEPARTMENT_OTHER): Payer: Self-pay | Admitting: *Deleted

## 2018-01-10 ENCOUNTER — Other Ambulatory Visit: Payer: Self-pay

## 2018-01-10 NOTE — Progress Notes (Signed)
SPOKE W/ PT VIA PHONE FOR PRE-OP INTERVIEW.  NPO AFTER MN.  ARRIVE AT 0630.  CURRENT LAB RESULTS , DATED 12-25-2017, IN CHART AND Epic.

## 2018-01-11 ENCOUNTER — Encounter (HOSPITAL_COMMUNITY): Admission: RE | Admit: 2018-01-11 | Payer: Medicare HMO | Source: Ambulatory Visit

## 2018-01-15 ENCOUNTER — Encounter (HOSPITAL_BASED_OUTPATIENT_CLINIC_OR_DEPARTMENT_OTHER): Payer: Self-pay | Admitting: Anesthesiology

## 2018-01-15 NOTE — H&P (Signed)
Urology Preoperative H&P   Chief Complaint: Bladder mass  History of Present Illness: Aaron Strong is a 67 y.o. male with a history of pT2c, pN0, Mx Gleason 4+3 prostate cancer, s/p RALP in 2015 with Dr. [name]. He was recently evaluated by Dr. [name] for on-going urinary incontinence following his prostatectomy and was found to have a small papillary bladder lesion adjacent to the right ureteral orifice concerning for UCC on flexible cystoscopy in the office. The patient had 2 episodes of painless gross hematuria approximately 6 weeks ago, that his symptoms resolved. He does have a long time to pack per day smoking history. No personal/family history of GU malignancies, aside from his prostate cancer.   CT urogram from November 09, 2017 also demonstrated a suspicious lesion within the bladder, concerning for malignancy.    Past Medical History:  Diagnosis Date  . Arthritis    hands   . Bladder tumor   . Full dentures   . Heavy cigarette smoker    2ppd since age 33  . History of concussion    per pt 1990s w/ loc-- no residuals  . History of heavy alcohol consumption    01-10-2018  last alcohol use 2014  . Prostate cancer [facility]) urologist-- dr Patrisha Hausmann  (previouly dr grapey)-- 01-10-2018 per pt no recurrence   dx 2015--  Stage T1c,  Gleason 6 and 7,  PSA 22--  02-18-2014  s/p  radical prostatectomy  . Urinary incontinence     Past Surgical History:  Procedure Laterality Date  . CARDIAC CATHETERIZATION  09-25-2000  [facility]   non-critical CAD ,  normal LVF (moderate diffuse disease along LAD w/ focal narrowing of mid 30-40%, diffuse CFx 30%,  mRCA 60% prior to takeoff AV marginal branch remainder of vessel mild diffuse disease  . CARDIOVASCULAR STRESS TEST  02/12/2014   normal nuclear study w/ no ischemia or reversibility/ normal LV function and wall function, ef78%  . LYMPHADENECTOMY Bilateral 02/18/2014   Procedure: PELVIC LYMPH NODE DISSECTION;  Surgeon: [name], MD;  Location: [facility]  ORS;  Service: Urology;  Laterality: Bilateral;  . ROBOT ASSISTED LAPAROSCOPIC RADICAL PROSTATECTOMY N/A 02/18/2014   Procedure: ROBOTIC ASSISTED LAPAROSCOPIC RADICAL PROSTATECTOMY;  Surgeon: [name], MD;  Location: [facility] ORS;  Service: Urology;  Laterality: N/A;  . TRANSTHORACIC ECHOCARDIOGRAM  02/12/2014   moderate focal basal and mid concentric LVH, ef 53-66%, grade 1 diastolic dysfunction/ trivial AR, MR, and PR/  mild TR    Allergies: No Known Allergies  Family History  Problem Relation Age of Onset  . Heart disease Father   . Heart attack Father   . Heart attack Brother   . Heart disease Brother   . Heart attack Brother   . Heart disease Brother     Social History:  reports that he has been smoking cigarettes.  He has a 80.00 pack-year smoking history. He has never used smokeless tobacco. He reports that he drank alcohol. He reports that he has current or past drug history. Drug: Marijuana.  ROS: A complete review of systems was performed.  All systems are negative except for pertinent findings as noted.  Physical Exam:  Vital signs in last 24 hours:   Constitutional:  Alert and oriented, No acute distress Cardiovascular: Regular rate and rhythm, No JVD Respiratory: Normal respiratory effort, Lungs clear bilaterally GI: Abdomen is soft, nontender, nondistended, no abdominal masses GU: No CVA tenderness Lymphatic: No lymphadenopathy Neurologic: Grossly intact, no focal deficits Psychiatric: Normal mood and affect  Laboratory Data:  No results for input(s): WBC, HGB, HCT, PLT in the last 72 hours.  No results for input(s): NA, K, CL, GLUCOSE, BUN, CALCIUM, CREATININE in the last 72 hours.  Invalid input(s): CO3   No results found for this or any previous visit (from the past 24 hour(s)). No results found for this or any previous visit (from the past 240 hour(s)).  Renal Function: No results for input(s): CREATININE in the last 168 hours. CrCl cannot be  calculated (Patient's most recent lab result is older than the maximum 21 days allowed.).  Radiologic Imaging: No results found.  I independently reviewed the above imaging studies.  Assessment and Plan Aaron Strong is a 67 y.o. male with a history of prostate cancer status post RALP in 2015 and a bladder mass concerning for malignancy.  -The risks, benefits and alternatives of cystoscopy with TURBT and possible right JJ stent placement was discussed with the patient. The risks included, but are not limited to, bleeding, urinary tract infection, bladder perforation requiring prolonged catheterization and/or open bladder repair, ureteral obstruction, voiding dysfunction and the inherent risks of general anesthesia. The patient voices understanding and wishes to proceed.   [name], MD 01/15/2018, 10:39 AM  [facility] Pager: [phone]

## 2018-01-15 NOTE — Anesthesia Preprocedure Evaluation (Addendum)
Anesthesia Evaluation  Patient identified by MRN, date of birth, ID band  Reviewed: Allergy & Precautions, NPO status , Patient's Chart, lab work & pertinent test results  Airway Mallampati: II  TM Distance: >3 FB Neck ROM: Limited    Dental  (+) Edentulous Upper, Edentulous Lower   Pulmonary Current Smoker,    Pulmonary exam normal        Cardiovascular Exercise Tolerance: Poor negative cardio ROS Normal cardiovascular exam Rhythm:Regular Rate:Normal     Neuro/Psych Depression negative neurological ROS     GI/Hepatic negative GI ROS, Neg liver ROS,   Endo/Other  negative endocrine ROS  Renal/GU negative Renal ROS  negative genitourinary   Musculoskeletal  (+) Arthritis ,   Abdominal (+) + obese,   Peds  Hematology negative hematology ROS (+)   Anesthesia Other Findings Aaron Strong  2D Echocardiogram without contrast  Order# [id number]  Ordering physician: Sueanne Margarita, MD Study date: 02/12/14 Result Notes for 2D Echocardiogram without contrast   Notes Recorded by Alcario Drought, CMA on 02/13/2014 at 2:56 PM Pt notified. ------  Notes Recorded by Alcario Drought, CMA on 02/12/2014 at 12:17 PM No answer/no vm ------  Notes Recorded by Sueanne Margarita, MD on 02/12/2014 at 11:36 AM Please let patient know that echo showed normal LVF with increased stiffness of heart muscle and mildly leaky TV   Study Result   Result status: Final result               *[facility]*            [address], [place] [address]              [phone]  ------------------------------------------------------------------- Transthoracic Echocardiography  Patient:  Aaron Strong, Aaron Strong MR #:    [id number] Study Date: 02/12/2014 Gender:   M Age:    67 Height:   165.1 cm Weight:   66.4 kg BSA:     1.75 m^2 Pt. Status: Room:  ORDERING   Fransico Him, MD SONOGRAPHER Leavy Cella PERFORMING  [facility], Inpatient  cc:  ------------------------------------------------------------------- LV EF: 55% -  60%  ------------------------------------------------------------------- Indications:   Chest pain 786.51.  ------------------------------------------------------------------- History:  PMH: ETOH, Preoperative Clearance, Prostate Cancer, Elevated PSA. Angina pectoris. Chronic obstructive pulmonary disease. Risk factors: Current tobacco use.    Reproductive/Obstetrics                         Anesthesia Physical Anesthesia Plan  ASA: II  Anesthesia Plan: General   Post-op Pain Management:    Induction: Intravenous  PONV Risk Score and Plan: 3 and Ondansetron, Dexamethasone and Midazolam  Airway Management Planned: LMA  Additional Equipment:   Intra-op Plan:   Post-operative Plan:   Informed Consent: I have reviewed the patients History and Physical, chart, labs and discussed the procedure including the risks, benefits and alternatives for the proposed anesthesia with the patient or authorized representative who has indicated his/her understanding and acceptance.   Dental advisory given  Plan Discussed with: CRNA and Surgeon  Anesthesia Plan Comments:       Anesthesia Quick Evaluation

## 2018-01-16 ENCOUNTER — Encounter (HOSPITAL_BASED_OUTPATIENT_CLINIC_OR_DEPARTMENT_OTHER)
Admission: RE | Disposition: A | Discharge status: Home / Self Care | Payer: Self-pay | Source: Ambulatory Visit | Attending: Urology

## 2018-01-16 ENCOUNTER — Encounter (HOSPITAL_BASED_OUTPATIENT_CLINIC_OR_DEPARTMENT_OTHER): Payer: Self-pay | Admitting: *Deleted

## 2018-01-16 ENCOUNTER — Telehealth: Payer: Self-pay | Admitting: Internal Medicine

## 2018-01-16 ENCOUNTER — Ambulatory Visit (HOSPITAL_BASED_OUTPATIENT_CLINIC_OR_DEPARTMENT_OTHER): Payer: Medicare HMO | Admitting: Anesthesiology

## 2018-01-16 ENCOUNTER — Ambulatory Visit (HOSPITAL_BASED_OUTPATIENT_CLINIC_OR_DEPARTMENT_OTHER)
Admission: RE | Admit: 2018-01-16 | Discharge: 2018-01-16 | Disposition: A | Discharge status: Home / Self Care | Payer: Medicare HMO | Source: Ambulatory Visit | Attending: Urology | Admitting: Urology

## 2018-01-16 ENCOUNTER — Other Ambulatory Visit: Payer: Self-pay

## 2018-01-16 DIAGNOSIS — I2510 Atherosclerotic heart disease of native coronary artery without angina pectoris: Secondary | ICD-10-CM | POA: Diagnosis not present

## 2018-01-16 DIAGNOSIS — Z8546 Personal history of malignant neoplasm of prostate: Secondary | ICD-10-CM | POA: Diagnosis not present

## 2018-01-16 DIAGNOSIS — N3289 Other specified disorders of bladder: Secondary | ICD-10-CM | POA: Diagnosis present

## 2018-01-16 DIAGNOSIS — Z9079 Acquired absence of other genital organ(s): Secondary | ICD-10-CM | POA: Insufficient documentation

## 2018-01-16 DIAGNOSIS — C679 Malignant neoplasm of bladder, unspecified: Secondary | ICD-10-CM | POA: Diagnosis not present

## 2018-01-16 DIAGNOSIS — F17210 Nicotine dependence, cigarettes, uncomplicated: Secondary | ICD-10-CM | POA: Diagnosis not present

## 2018-01-16 DIAGNOSIS — D494 Neoplasm of unspecified behavior of bladder: Secondary | ICD-10-CM | POA: Diagnosis not present

## 2018-01-16 HISTORY — DX: Complete loss of teeth, unspecified cause, unspecified class: K08.109

## 2018-01-16 HISTORY — PX: TRANSURETHRAL RESECTION OF BLADDER TUMOR: SHX2575

## 2018-01-16 HISTORY — DX: Personal history of traumatic brain injury: Z87.820

## 2018-01-16 HISTORY — DX: Complete loss of teeth, unspecified cause, unspecified class: Z97.2

## 2018-01-16 HISTORY — DX: Nicotine dependence, cigarettes, uncomplicated: F17.210

## 2018-01-16 HISTORY — DX: Unspecified urinary incontinence: R32

## 2018-01-16 HISTORY — DX: Malignant neoplasm of prostate: C61

## 2018-01-16 HISTORY — DX: Personal history of other specified conditions: Z87.898

## 2018-01-16 SURGERY — TURBT (TRANSURETHRAL RESECTION OF BLADDER TUMOR)
Anesthesia: General

## 2018-01-16 MED ORDER — MEPERIDINE HCL 25 MG/ML IJ SOLN
6.2500 mg | INTRAMUSCULAR | Status: DC | PRN
  Filled 2018-01-16: qty 1

## 2018-01-16 MED ORDER — FENTANYL CITRATE (PF) 100 MCG/2ML IJ SOLN
INTRAMUSCULAR | Status: DC | PRN
  Administered 2018-01-16: 50 ug via INTRAVENOUS

## 2018-01-16 MED ORDER — KETOROLAC TROMETHAMINE 30 MG/ML IJ SOLN
INTRAMUSCULAR | Status: AC
  Filled 2018-01-16: qty 1

## 2018-01-16 MED ORDER — SULFAMETHOXAZOLE-TRIMETHOPRIM 800-160 MG PO TABS: 1.0000 | ORAL_TABLET | Freq: Two times a day (BID) | ORAL | 0 refills | Status: AC

## 2018-01-16 MED ORDER — MIDAZOLAM HCL 2 MG/2ML IJ SOLN
INTRAMUSCULAR | Status: DC | PRN
  Administered 2018-01-16: 2 mg via INTRAVENOUS

## 2018-01-16 MED ORDER — ONDANSETRON HCL 4 MG/2ML IJ SOLN
4.0000 mg | Freq: Once | INTRAMUSCULAR | Status: DC | PRN
  Filled 2018-01-16: qty 2

## 2018-01-16 MED ORDER — OXYCODONE HCL 5 MG PO TABS
5.0000 mg | ORAL_TABLET | Freq: Once | ORAL | Status: DC | PRN
  Filled 2018-01-16: qty 1

## 2018-01-16 MED ORDER — SODIUM CHLORIDE 0.9 % IR SOLN
Status: DC | PRN
  Administered 2018-01-16 (×2): 3000 mL via INTRAVESICAL

## 2018-01-16 MED ORDER — HYDROCODONE-ACETAMINOPHEN 5-325 MG PO TABS: 1.0000 | ORAL_TABLET | ORAL | 0 refills | Status: DC | PRN

## 2018-01-16 MED ORDER — PHENAZOPYRIDINE HCL 100 MG PO TABS
ORAL_TABLET | ORAL | Status: AC
  Filled 2018-01-16: qty 2

## 2018-01-16 MED ORDER — FENTANYL CITRATE (PF) 100 MCG/2ML IJ SOLN
INTRAMUSCULAR | Status: AC
  Filled 2018-01-16: qty 4

## 2018-01-16 MED ORDER — ONDANSETRON HCL 4 MG/2ML IJ SOLN
INTRAMUSCULAR | Status: AC
  Filled 2018-01-16: qty 2

## 2018-01-16 MED ORDER — LACTATED RINGERS IV SOLN
INTRAVENOUS | Status: DC
  Administered 2018-01-16: 07:00:00 via INTRAVENOUS
  Filled 2018-01-16: qty 1000

## 2018-01-16 MED ORDER — DEXAMETHASONE SODIUM PHOSPHATE 10 MG/ML IJ SOLN
INTRAMUSCULAR | Status: DC | PRN
  Administered 2018-01-16: 10 mg via INTRAVENOUS

## 2018-01-16 MED ORDER — ONDANSETRON HCL 4 MG PO TABS: 4.0000 mg | ORAL_TABLET | Freq: Every day | ORAL | 1 refills | Status: DC | PRN

## 2018-01-16 MED ORDER — ACETAMINOPHEN 325 MG PO TABS
325.0000 mg | ORAL_TABLET | ORAL | Status: DC | PRN
  Filled 2018-01-16: qty 2

## 2018-01-16 MED ORDER — PROPOFOL 10 MG/ML IV BOLUS
INTRAVENOUS | Status: DC | PRN
  Administered 2018-01-16: 100 mg via INTRAVENOUS

## 2018-01-16 MED ORDER — PROPOFOL 10 MG/ML IV BOLUS
INTRAVENOUS | Status: AC
  Filled 2018-01-16: qty 40

## 2018-01-16 MED ORDER — LIDOCAINE 2% (20 MG/ML) 5 ML SYRINGE
INTRAMUSCULAR | Status: DC | PRN
  Administered 2018-01-16: 60 mg via INTRAVENOUS

## 2018-01-16 MED ORDER — OXYCODONE HCL 5 MG/5ML PO SOLN
5.0000 mg | Freq: Once | ORAL | Status: DC | PRN
  Filled 2018-01-16: qty 5

## 2018-01-16 MED ORDER — KETOROLAC TROMETHAMINE 30 MG/ML IJ SOLN
30.0000 mg | Freq: Once | INTRAMUSCULAR | Status: DC | PRN
  Filled 2018-01-16: qty 1

## 2018-01-16 MED ORDER — FENTANYL CITRATE (PF) 100 MCG/2ML IJ SOLN
25.0000 ug | INTRAMUSCULAR | Status: DC | PRN
  Filled 2018-01-16: qty 1

## 2018-01-16 MED ORDER — ONDANSETRON HCL 4 MG/2ML IJ SOLN
INTRAMUSCULAR | Status: DC | PRN
  Administered 2018-01-16: 4 mg via INTRAVENOUS

## 2018-01-16 MED ORDER — CEFAZOLIN SODIUM-DEXTROSE 2-4 GM/100ML-% IV SOLN
INTRAVENOUS | Status: AC
Start: 2018-01-16 — End: 2018-01-16
  Filled 2018-01-16: qty 100

## 2018-01-16 MED ORDER — PHENAZOPYRIDINE HCL 200 MG PO TABS: 200.0000 mg | ORAL_TABLET | Freq: Three times a day (TID) | ORAL | 0 refills | Status: DC | PRN

## 2018-01-16 MED ORDER — PHENAZOPYRIDINE HCL 200 MG PO TABS
200.0000 mg | ORAL_TABLET | Freq: Three times a day (TID) | ORAL | Status: DC
  Administered 2018-01-16: 200 mg via ORAL
  Filled 2018-01-16: qty 1

## 2018-01-16 MED ORDER — CEFAZOLIN SODIUM-DEXTROSE 2-4 GM/100ML-% IV SOLN
2.0000 g | Freq: Once | INTRAVENOUS | Status: AC
  Administered 2018-01-16: 2 g via INTRAVENOUS
  Filled 2018-01-16: qty 100

## 2018-01-16 MED ORDER — MIDAZOLAM HCL 2 MG/2ML IJ SOLN
INTRAMUSCULAR | Status: AC
  Filled 2018-01-16: qty 2

## 2018-01-16 MED ORDER — ACETAMINOPHEN 160 MG/5ML PO SOLN
325.0000 mg | ORAL | Status: DC | PRN
  Filled 2018-01-16: qty 20.3

## 2018-01-16 SURGICAL SUPPLY — 18 items
BAG DRAIN URO-CYSTO SKYTR STRL (DRAIN) ×6 IMPLANT
BAG URINE DRAINAGE (UROLOGICAL SUPPLIES) IMPLANT
BAG URINE LEG 500ML (DRAIN) IMPLANT
GLOVE BIO SURGEON STRL SZ7.5 (GLOVE) ×3 IMPLANT
GOWN STRL REUS W/ TWL XL LVL3 (GOWN DISPOSABLE) ×1 IMPLANT
GOWN STRL REUS W/TWL XL LVL3 (GOWN DISPOSABLE) ×2
HOLDER FOLEY CATH W/STRAP (MISCELLANEOUS) IMPLANT
IV NS IRRIG 3000ML ARTHROMATIC (IV SOLUTION) IMPLANT
LOOP CUT BIPOLAR 24F LRG (ELECTROSURGICAL) ×3 IMPLANT
MANIFOLD NEPTUNE II (INSTRUMENTS) ×3 IMPLANT
NS IRRIG 500ML POUR BTL (IV SOLUTION) IMPLANT
PACK CYSTO (CUSTOM PROCEDURE TRAY) ×3 IMPLANT
SYRINGE IRR TOOMEY STRL 70CC (SYRINGE) ×3 IMPLANT
TUBE CONNECTING 12'X1/4 (SUCTIONS) ×1
TUBE CONNECTING 12X1/4 (SUCTIONS) ×2 IMPLANT
TUBING UROLOGY SET (TUBING) ×3 IMPLANT
WATER STERILE IRR 3000ML UROMA (IV SOLUTION) IMPLANT
WATER STERILE IRR 500ML POUR (IV SOLUTION) IMPLANT

## 2018-01-16 NOTE — Discharge Instructions (Signed)

## 2018-01-16 NOTE — Transfer of Care (Signed)
Immediate Anesthesia Transfer of Care Note  Patient: Aaron Strong  Procedure(s) Performed: TRANSURETHRAL RESECTION OF BLADDER TUMOR (TURBT)/CYSTOSCOPY/ (N/A )  Patient Location: PACU  Anesthesia Type:General  Level of Consciousness: awake, alert , oriented and patient cooperative  Airway & Oxygen Therapy: Patient Spontanous Breathing and Patient connected to face mask oxygen  Post-op Assessment: Report given to RN and Post -op Vital signs reviewed and stable  Post vital signs: Reviewed and stable  Last Vitals:  Vitals Value Taken Time  BP    Temp    Pulse 93 01/16/2018  9:01 AM  Resp 13 01/16/2018  9:01 AM  SpO2 95 % 01/16/2018  9:01 AM  Vitals shown include unvalidated device data.  Last Pain:  Vitals:   01/16/18 4356  TempSrc: Oral         Complications: No apparent anesthesia complications

## 2018-01-16 NOTE — Telephone Encounter (Signed)
He needs to go to Emergency Department for evaluation

## 2018-01-16 NOTE — Anesthesia Procedure Notes (Signed)
Procedure Name: LMA Insertion Date/Time: 01/16/2018 8:27 AM Performed by: Wanita Chamberlain, CRNA Pre-anesthesia Checklist: Patient identified, Timeout performed, Emergency Drugs available, Suction available and Patient being monitored Patient Re-evaluated:Patient Re-evaluated prior to induction Oxygen Delivery Method: Circle system utilized Preoxygenation: Pre-oxygenation with 100% oxygen Induction Type: IV induction Ventilation: Mask ventilation without difficulty LMA: LMA inserted LMA Size: 5.0 Number of attempts: 1 Placement Confirmation: positive ETCO2,  CO2 detector and breath sounds checked- equal and bilateral Tube secured with: Tape Dental Injury: Teeth and Oropharynx as per pre-operative assessment

## 2018-01-16 NOTE — Telephone Encounter (Signed)
I called and spoke with Zollie Scale and let her know that Dr Renold Genta is advising patient be evaluated at the Emergency Department. She stated that he is at her home resting and is still under anesthesia so now would not be a good time to take him to ED. She is going to pick up an O2 stat device and monitor him for 24 hours, and them if need be she will take him to ED

## 2018-01-16 NOTE — Telephone Encounter (Signed)
Zollie Scale Sister [phone]  Lorriane Shire called to say that Elson is at [facility] having procedure and that the Nurse said his oxygen level was 73 when he got there and they now have it up to 79 with him on oxygen. The nurse ask her to call doctors office and let them know that Levern may need a chest xray. Lorriane Shire stated that Cong is wheezing a lot. Osten is going to be going home with sister and staying there for a few days following this procedure.

## 2018-01-16 NOTE — Anesthesia Postprocedure Evaluation (Signed)
Anesthesia Post Note  Patient: Aaron Strong  Procedure(s) Performed: TRANSURETHRAL RESECTION OF BLADDER TUMOR (TURBT)/CYSTOSCOPY/ (N/A )     Patient location during evaluation: PACU Anesthesia Type: General Level of consciousness: awake Pain management: pain level controlled Vital Signs Assessment: post-procedure vital signs reviewed and stable Respiratory status: spontaneous breathing Cardiovascular status: stable Postop Assessment: no apparent nausea or vomiting Anesthetic complications: no    Last Vitals:  Vitals:   01/16/18 0930 01/16/18 0945  BP: (!) 160/79 (!) 166/81  Pulse: 99 96  Resp: (!) 21 (!) 24  Temp:    SpO2: 92% (!) 83%    Last Pain:  Vitals:   01/16/18 0613  TempSrc: Oral   Pain Goal:                 Nilsa Macht JR,JOHN Kellsie Grindle

## 2018-01-16 NOTE — Op Note (Signed)
Operative Note  Preoperative diagnosis:  1.  2 cm papillary bladder tumor adjacent to the right ureteral orifice  Postoperative diagnosis: 1.  2 cm papillary bladder tumor adjacent to the right ureteral orifice  Procedure(s): 1.  Cystoscopy with TURBT (small)  Surgeon: [name], MD  Assistants: None  Anesthesia: General  Complications: None  EBL: Less than 5 mL  Specimens: 1.  Superficial and deep bladder tumor  Drains/Catheters: 1.  None  Intraoperative findings:   1. 2 cm papillary bladder tumor adjacent to the right ureteral orifice, but not directly involving it.  The tumor appeared grossly superficial  Indication:  Aaron Strong is a 67 y.o. male with a history of pT2c, pN0, Mx Gleason 4+3 prostate cancer, s/p RALP in 2015 with Dr. [name]. He was recently evaluated by Dr. [name] for on-going urinary incontinence following his prostatectomy and was found to have a small papillary bladder lesion adjacent to the right ureteral orifice concerning for UCC on flexible cystoscopy in the office. The patient had 2 episodes of painless gross hematuria approximately 6 weeks ago, that his symptoms resolved. He does have a long time to pack per day smoking history. No personal/family history of GU malignancies, aside from his prostate cancer.   CT urogram from November 09, 2017 also demonstrated a suspicious lesion within the bladder, concerning for malignancy.  He has been consented for the above procedures, voices understanding and wishes to proceed.  Description of procedure:  After informed consent was obtained, the patient was brought to the operating room and general LMA anesthesia was administered. The patient was then placed in the dorsolithotomy position and prepped and draped in usual sterile fashion. A timeout was performed. A 23 French rigid cystoscope was then inserted into the urethral meatus and advanced into the bladder under direct vision. A complete bladder  survey revealed a 2 cm papillary bladder tumor adjacent to the right ureteral orifice, but did not appear to be directly involving it.  There are no other bladder tumors involving the remainder of the bladder mucosa or lower urinary tract.  The rigid cystoscope was then removed and a 34 [place] resectoscope with a bipolar loop working element was then inserted into the urethral meatus and advanced into the bladder.  The bladder tumor was then systematically resected superficially with the specimen sent separately.  Once all superficial tumor was cleared from the bladder,  deep margins were resected down to the detrusor musculature and sent for pathologic analysis.  The area of resection was then extensively fulgurated until hemostasis was achieved.  The right ureteral orifice was effluxing clear urine at the conclusion of the case.  The bladder was emptied.  He tolerated the procedure well and was transferred to the postanesthesia unit in stable condition.  Plan: The patient will follow-up on 01/31/2018 to discuss pathology results

## 2018-01-17 ENCOUNTER — Encounter (HOSPITAL_BASED_OUTPATIENT_CLINIC_OR_DEPARTMENT_OTHER): Payer: Self-pay | Admitting: Urology

## 2018-01-31 DIAGNOSIS — C672 Malignant neoplasm of lateral wall of bladder: Secondary | ICD-10-CM | POA: Diagnosis not present

## 2018-02-05 ENCOUNTER — Other Ambulatory Visit: Payer: Self-pay | Admitting: Urology

## 2018-02-27 ENCOUNTER — Encounter (HOSPITAL_BASED_OUTPATIENT_CLINIC_OR_DEPARTMENT_OTHER): Payer: Self-pay | Admitting: *Deleted

## 2018-02-27 ENCOUNTER — Other Ambulatory Visit: Payer: Self-pay

## 2018-02-27 NOTE — Progress Notes (Addendum)
Spoke w/ pt via phone for pre-op interview.  Npo after mn.  Arrive at 0530.  Needs ekg.

## 2018-03-05 NOTE — H&P (Signed)
Urology Preoperative H&P   Chief Complaint: Bladder cancer  History of Present Illness: Aaron Strong is a 67 y.o. male with a history of pT2c, pN0, Mx Gleason 4+3 prostate cancer, s/p RALP in 2015 with Dr. [name]. He was recently evaluated by Dr. [name] for on-going urinary incontinence following his prostatectomy and was found to have a small papillary bladder lesion adjacent to the right ureteral orifice concerning for UCC on flexible cystoscopy in the office. He does have a long time to pack per day smoking history. No personal/family history of GU malignancies, aside from his prostate cancer.   He is s/p TURBT on 01/16/18. He is urinating without difficulty and denies interval urinary tract infections, dysuria or hematuria.   Pathology: High grade T1 UCC. Muscle was present in both the superficial and deep specimens and was uninvolved with tumor.   CT Urogram (11/09/17)- demonstrated his bladder mass, with no evidence of metastatic or upper tract disease     Past Medical History:  Diagnosis Date  . Arthritis    hands   . Bladder cancer [facility]) urologist-  dr winter   dx 01-16-2018 s/p  TURBT,  high grade T1c urothelial carcinoma with muscle involvement  . Coronary artery disease    per cardiac cath 2002 non-critical nonobstructive cad;  normal nuclear stress test in 2015  . Full dentures   . Heavy cigarette smoker    2ppd since age 56  . History of concussion    per pt 1990s w/ loc-- no residuals  . History of heavy alcohol consumption    01-10-2018  last alcohol use 2014  . Prostate cancer [facility]) urologist-- dr winter  (previouly dr grapey)-- 01-10-2018 per pt no recurrence   dx 2015--  Stage pT2c,  Gleason 6 and 7,  PSA 22--  02-18-2014  s/p  radical prostatectomy  . Urinary incontinence     Past Surgical History:  Procedure Laterality Date  . CARDIAC CATHETERIZATION  09-25-2000  [facility]   non-critical CAD ,  normal LVF (moderate diffuse disease along LAD w/ focal narrowing of  mid 30-40%, diffuse CFx 30%,  mRCA 60% prior to takeoff AV marginal branch remainder of vessel mild diffuse disease  . CARDIOVASCULAR STRESS TEST  02/12/2014   normal nuclear study w/ no ischemia or reversibility/ normal LV function and wall function, ef78%  . LYMPHADENECTOMY Bilateral 02/18/2014   Procedure: PELVIC LYMPH NODE DISSECTION;  Surgeon: [name], MD;  Location: [facility];  Service: Urology;  Laterality: Bilateral;  . ROBOT ASSISTED LAPAROSCOPIC RADICAL PROSTATECTOMY N/A 02/18/2014   Procedure: ROBOTIC ASSISTED LAPAROSCOPIC RADICAL PROSTATECTOMY;  Surgeon: [name], MD;  Location: [facility];  Service: Urology;  Laterality: N/A;  . TRANSTHORACIC ECHOCARDIOGRAM  02/12/2014   moderate focal basal and mid concentric LVH, ef 13-24%, grade 1 diastolic dysfunction/ trivial AR, MR, and PR/  mild TR  . TRANSURETHRAL RESECTION OF BLADDER TUMOR N/A 01/16/2018   Procedure: TRANSURETHRAL RESECTION OF BLADDER TUMOR (TURBT)/CYSTOSCOPY/;  Surgeon: [name], MD;  Location: [facility];  Service: Urology;  Laterality: N/A;     Allergies: No Known Allergies  Family History  Problem Relation Age of Onset  . Heart disease Father   . Heart attack Father   . Heart attack Brother   . Heart disease Brother   . Heart attack Brother   . Heart disease Brother     Social History:  reports that he has been smoking cigarettes.  He has a 80.00 pack-year smoking  history. He has never used smokeless tobacco. He reports that he drank alcohol. He reports that he has current or past drug history. Drug: Marijuana.  ROS: A complete review of systems was performed.  All systems are negative except for pertinent findings as noted.  Physical Exam:  Vital signs in last 24 hours:   Constitutional:  Alert and oriented, No acute distress Cardiovascular: Regular rate and rhythm, No JVD Respiratory: Normal respiratory effort, Lungs clear bilaterally GI: Abdomen is soft, nontender,  nondistended, no abdominal masses GU: No CVA tenderness Lymphatic: No lymphadenopathy Neurologic: Grossly intact, no focal deficits Psychiatric: Normal mood and affect  Laboratory Data:  No results for input(s): WBC, HGB, HCT, PLT in the last 72 hours.  No results for input(s): NA, K, CL, GLUCOSE, BUN, CALCIUM, CREATININE in the last 72 hours.  Invalid input(s): CO3   No results found for this or any previous visit (from the past 24 hour(s)). No results found for this or any previous visit (from the past 240 hour(s)).  Renal Function: No results for input(s): CREATININE in the last 168 hours. CrCl cannot be calculated (Patient's most recent lab result is older than the maximum 21 days allowed.).  Radiologic Imaging: No results found.  I independently reviewed the above imaging studies.  Assessment and Plan Aaron Strong is a 67 y.o. male with high grade, T1 urothelial carcinoma of the bladder  -Pathology report and the treatment of high-grade T1 urothelial carcinoma was discussed extensively with the patient and his wife. His treatment options at this point are repeat TURBT and start intravesical therapy or proceed to cystoprostatectomy with ileal conduit urinary diversion. He would like to proceed with repeat TURBT and intravesical therapy. The risk of progression and/or recurrence was discussed with the patient and his wife and they both voiced understanding.  -I strongly encouraged him to stop smoking cigarettes   -The risks, benefits and alternatives of cystoscopy with TURBT was discussed with the patient. The risks included, but are not limited to, bleeding, urinary tract infection, bladder perforation requiring prolonged catheterization and/or open bladder repair, ureteral obstruction, voiding dysfunction and the inherent risks of general anesthesia. The patient voices understanding and wishes to proceed.    Aaron Hughs, MD 03/05/2018, 7:47 AM  [facility] Pager: [phone]

## 2018-03-05 NOTE — Anesthesia Preprocedure Evaluation (Addendum)
Anesthesia Evaluation  Patient identified by MRN, date of birth, ID band Patient awake    Reviewed: Allergy & Precautions, NPO status , Patient's Chart, lab work & pertinent test results  History of Anesthesia Complications Negative for: history of anesthetic complications  Airway Mallampati: I  TM Distance: >3 FB Neck ROM: Full    Dental  (+) Edentulous Upper, Edentulous Lower   Pulmonary COPD, Current Smoker,    breath sounds clear to auscultation       Cardiovascular (-) angina+ CAD (non-obstructive)   Rhythm:Regular Rate:Normal  '15 stress: normal '15 ECHO: EF 55-60%, valves OK   Neuro/Psych negative neurological ROS     GI/Hepatic negative GI ROS, Neg liver ROS, H/o ETOH abuse: 2014   Endo/Other  negative endocrine ROS  Renal/GU negative Renal ROS   Prostate cancer Bladder cancer    Musculoskeletal  (+) Arthritis ,   Abdominal   Peds  Hematology negative hematology ROS (+)   Anesthesia Other Findings   Reproductive/Obstetrics                            Anesthesia Physical Anesthesia Plan  ASA: III  Anesthesia Plan: General   Post-op Pain Management:    Induction: Intravenous  PONV Risk Score and Plan: 1 and Ondansetron and Dexamethasone  Airway Management Planned: LMA  Additional Equipment:   Intra-op Plan:   Post-operative Plan:   Informed Consent: I have reviewed the patients History and Physical, chart, labs and discussed the procedure including the risks, benefits and alternatives for the proposed anesthesia with the patient or authorized representative who has indicated his/her understanding and acceptance.     Plan Discussed with: CRNA and Surgeon  Anesthesia Plan Comments: (Plan routine monitors, GA- LMA)       Anesthesia Quick Evaluation

## 2018-03-06 ENCOUNTER — Ambulatory Visit (HOSPITAL_BASED_OUTPATIENT_CLINIC_OR_DEPARTMENT_OTHER): Payer: Medicare HMO | Admitting: Anesthesiology

## 2018-03-06 ENCOUNTER — Other Ambulatory Visit: Payer: Self-pay

## 2018-03-06 ENCOUNTER — Encounter (HOSPITAL_BASED_OUTPATIENT_CLINIC_OR_DEPARTMENT_OTHER): Payer: Self-pay | Admitting: *Deleted

## 2018-03-06 ENCOUNTER — Encounter (HOSPITAL_COMMUNITY)
Admission: RE | Disposition: A | Discharge status: Home / Self Care | Payer: Self-pay | Source: Ambulatory Visit | Attending: Urology

## 2018-03-06 ENCOUNTER — Telehealth: Payer: Self-pay

## 2018-03-06 ENCOUNTER — Observation Stay (HOSPITAL_COMMUNITY): Payer: Medicare HMO

## 2018-03-06 ENCOUNTER — Observation Stay (HOSPITAL_BASED_OUTPATIENT_CLINIC_OR_DEPARTMENT_OTHER)
Admission: RE | Admit: 2018-03-06 | Discharge: 2018-03-08 | Disposition: A | Discharge status: Home / Self Care | Payer: Medicare HMO | Source: Ambulatory Visit | Attending: Urology | Admitting: Urology

## 2018-03-06 DIAGNOSIS — R011 Cardiac murmur, unspecified: Secondary | ICD-10-CM | POA: Diagnosis not present

## 2018-03-06 DIAGNOSIS — Z9079 Acquired absence of other genital organ(s): Secondary | ICD-10-CM | POA: Insufficient documentation

## 2018-03-06 DIAGNOSIS — Z720 Tobacco use: Secondary | ICD-10-CM | POA: Diagnosis not present

## 2018-03-06 DIAGNOSIS — C679 Malignant neoplasm of bladder, unspecified: Principal | ICD-10-CM | POA: Diagnosis present

## 2018-03-06 DIAGNOSIS — J449 Chronic obstructive pulmonary disease, unspecified: Secondary | ICD-10-CM | POA: Diagnosis not present

## 2018-03-06 DIAGNOSIS — R0902 Hypoxemia: Secondary | ICD-10-CM | POA: Insufficient documentation

## 2018-03-06 DIAGNOSIS — Z8546 Personal history of malignant neoplasm of prostate: Secondary | ICD-10-CM | POA: Insufficient documentation

## 2018-03-06 DIAGNOSIS — I2510 Atherosclerotic heart disease of native coronary artery without angina pectoris: Secondary | ICD-10-CM | POA: Diagnosis not present

## 2018-03-06 DIAGNOSIS — F17210 Nicotine dependence, cigarettes, uncomplicated: Secondary | ICD-10-CM | POA: Insufficient documentation

## 2018-03-06 DIAGNOSIS — C672 Malignant neoplasm of lateral wall of bladder: Secondary | ICD-10-CM | POA: Diagnosis not present

## 2018-03-06 DIAGNOSIS — N3289 Other specified disorders of bladder: Secondary | ICD-10-CM | POA: Diagnosis not present

## 2018-03-06 HISTORY — DX: Atherosclerotic heart disease of native coronary artery without angina pectoris: I25.10

## 2018-03-06 HISTORY — PX: CYSTOSCOPY: SHX5120

## 2018-03-06 HISTORY — DX: Malignant neoplasm of bladder, unspecified: C67.9

## 2018-03-06 HISTORY — PX: TRANSURETHRAL RESECTION OF BLADDER TUMOR: SHX2575

## 2018-03-06 SURGERY — TURBT (TRANSURETHRAL RESECTION OF BLADDER TUMOR)
Anesthesia: General

## 2018-03-06 MED ORDER — PROMETHAZINE HCL 25 MG/ML IJ SOLN
6.2500 mg | INTRAMUSCULAR | Status: DC | PRN
  Filled 2018-03-06: qty 1

## 2018-03-06 MED ORDER — MITOMYCIN CHEMO FOR BLADDER INSTILLATION 40 MG
40.0000 mg | Freq: Once | INTRAVENOUS | Status: AC
  Administered 2018-03-06: 40 mg via INTRAVESICAL
  Filled 2018-03-06: qty 40

## 2018-03-06 MED ORDER — ALBUTEROL SULFATE (2.5 MG/3ML) 0.083% IN NEBU
INHALATION_SOLUTION | RESPIRATORY_TRACT | Status: AC
  Filled 2018-03-06: qty 3

## 2018-03-06 MED ORDER — LACTATED RINGERS IV SOLN
INTRAVENOUS | Status: DC
  Administered 2018-03-06: 06:00:00 via INTRAVENOUS
  Filled 2018-03-06: qty 1000

## 2018-03-06 MED ORDER — PROPOFOL 10 MG/ML IV BOLUS
INTRAVENOUS | Status: AC
  Filled 2018-03-06: qty 20

## 2018-03-06 MED ORDER — FENTANYL CITRATE (PF) 100 MCG/2ML IJ SOLN
INTRAMUSCULAR | Status: DC | PRN
  Administered 2018-03-06: 50 ug via INTRAVENOUS

## 2018-03-06 MED ORDER — SULFAMETHOXAZOLE-TRIMETHOPRIM 800-160 MG PO TABS: 1.0000 | ORAL_TABLET | Freq: Two times a day (BID) | ORAL | 0 refills | Status: AC

## 2018-03-06 MED ORDER — IPRATROPIUM-ALBUTEROL 0.5-2.5 (3) MG/3ML IN SOLN: 3.0000 mL | RESPIRATORY_TRACT | Status: DC | PRN

## 2018-03-06 MED ORDER — PROPOFOL 10 MG/ML IV BOLUS
INTRAVENOUS | Status: DC | PRN
  Administered 2018-03-06: 100 mg via INTRAVENOUS

## 2018-03-06 MED ORDER — MIDAZOLAM HCL 2 MG/2ML IJ SOLN
INTRAMUSCULAR | Status: DC | PRN
  Administered 2018-03-06 (×2): 1 mg via INTRAVENOUS

## 2018-03-06 MED ORDER — LIDOCAINE 2% (20 MG/ML) 5 ML SYRINGE
INTRAMUSCULAR | Status: DC | PRN
  Administered 2018-03-06: 50 mg via INTRAVENOUS

## 2018-03-06 MED ORDER — FENTANYL CITRATE (PF) 100 MCG/2ML IJ SOLN
25.0000 ug | INTRAMUSCULAR | Status: DC | PRN
  Filled 2018-03-06: qty 1

## 2018-03-06 MED ORDER — ONDANSETRON HCL 4 MG PO TABS: 4.0000 mg | ORAL_TABLET | Freq: Every day | ORAL | 1 refills | Status: DC | PRN

## 2018-03-06 MED ORDER — MIDAZOLAM HCL 2 MG/2ML IJ SOLN
INTRAMUSCULAR | Status: AC
  Filled 2018-03-06: qty 2

## 2018-03-06 MED ORDER — CEFAZOLIN SODIUM-DEXTROSE 2-4 GM/100ML-% IV SOLN
INTRAVENOUS | Status: AC
  Filled 2018-03-06: qty 100

## 2018-03-06 MED ORDER — OXYBUTYNIN CHLORIDE 5 MG PO TABS: 5.0000 mg | ORAL_TABLET | Freq: Three times a day (TID) | ORAL | 1 refills | Status: DC | PRN

## 2018-03-06 MED ORDER — ONDANSETRON HCL 4 MG/2ML IJ SOLN
INTRAMUSCULAR | Status: AC
  Filled 2018-03-06: qty 2

## 2018-03-06 MED ORDER — SODIUM CHLORIDE 0.9 % IR SOLN
Status: DC | PRN
  Administered 2018-03-06: 3000 mL

## 2018-03-06 MED ORDER — ALBUTEROL SULFATE (2.5 MG/3ML) 0.083% IN NEBU
2.5000 mg | INHALATION_SOLUTION | Freq: Once | RESPIRATORY_TRACT | Status: AC
  Administered 2018-03-06: 2.5 mg via RESPIRATORY_TRACT
  Filled 2018-03-06: qty 3

## 2018-03-06 MED ORDER — MIDAZOLAM HCL 2 MG/2ML IJ SOLN
0.5000 mg | Freq: Once | INTRAMUSCULAR | Status: DC | PRN
  Filled 2018-03-06: qty 2

## 2018-03-06 MED ORDER — DEXAMETHASONE SODIUM PHOSPHATE 10 MG/ML IJ SOLN
INTRAMUSCULAR | Status: DC | PRN
  Administered 2018-03-06: 10 mg via INTRAVENOUS

## 2018-03-06 MED ORDER — CEFAZOLIN SODIUM-DEXTROSE 2-4 GM/100ML-% IV SOLN
2.0000 g | Freq: Once | INTRAVENOUS | Status: AC
  Administered 2018-03-06: 2 g via INTRAVENOUS
  Filled 2018-03-06: qty 100

## 2018-03-06 MED ORDER — MEPERIDINE HCL 25 MG/ML IJ SOLN
6.2500 mg | INTRAMUSCULAR | Status: DC | PRN
  Filled 2018-03-06: qty 1

## 2018-03-06 MED ORDER — FENTANYL CITRATE (PF) 100 MCG/2ML IJ SOLN
INTRAMUSCULAR | Status: AC
  Filled 2018-03-06: qty 2

## 2018-03-06 MED ORDER — LIDOCAINE 2% (20 MG/ML) 5 ML SYRINGE
INTRAMUSCULAR | Status: AC
  Filled 2018-03-06: qty 5

## 2018-03-06 SURGICAL SUPPLY — 19 items
BAG DRAIN URO-CYSTO SKYTR STRL (DRAIN) ×3 IMPLANT
BAG URINE DRAINAGE (UROLOGICAL SUPPLIES) ×3 IMPLANT
BAG URINE LEG 500ML (DRAIN) IMPLANT
CATH FOLEY 2WAY SLVR  5CC 16FR (CATHETERS) ×2
CATH FOLEY 2WAY SLVR 5CC 16FR (CATHETERS) ×1 IMPLANT
GLOVE BIO SURGEON STRL SZ7.5 (GLOVE) ×3 IMPLANT
GOWN STRL REUS W/TWL XL LVL3 (GOWN DISPOSABLE) ×6 IMPLANT
HOLDER FOLEY CATH W/STRAP (MISCELLANEOUS) ×3 IMPLANT
IV NS IRRIG 3000ML ARTHROMATIC (IV SOLUTION) IMPLANT
LOOP CUT BIPOLAR 24F LRG (ELECTROSURGICAL) IMPLANT
MANIFOLD NEPTUNE II (INSTRUMENTS) ×3 IMPLANT
NS IRRIG 500ML POUR BTL (IV SOLUTION) IMPLANT
PACK CYSTO (CUSTOM PROCEDURE TRAY) ×3 IMPLANT
SYRINGE IRR TOOMEY STRL 70CC (SYRINGE) ×3 IMPLANT
TUBE CONNECTING 12'X1/4 (SUCTIONS) ×1
TUBE CONNECTING 12X1/4 (SUCTIONS) ×2 IMPLANT
TUBING UROLOGY SET (TUBING) ×3 IMPLANT
WATER STERILE IRR 3000ML UROMA (IV SOLUTION) ×3 IMPLANT
WATER STERILE IRR 500ML POUR (IV SOLUTION) ×3 IMPLANT

## 2018-03-06 NOTE — Transfer of Care (Signed)
Immediate Anesthesia Transfer of Care Note  Patient: Aaron Strong  Procedure(s) Performed: Procedure(s) (LRB): TRANSURETHRAL RESECTION OF BLADDER TUMOR (TURBT) (N/A) CYSTOSCOPY WITH INSTILLATION OF MITOMYCIN (N/A)  Patient Location: PACU  Anesthesia Type: General  Level of Consciousness: awake, oriented, sedated and patient cooperative  Airway & Oxygen Therapy: Patient Spontanous Breathing and Patient connected to face mask oxygen  Post-op Assessment: Report given to PACU RN and Post -op Vital signs reviewed and stable  Post vital signs: Reviewed and stable  Complications: No apparent anesthesia complications  Last Vitals:  Vitals Value Taken Time  BP    Temp    Pulse 103 03/06/2018  8:06 AM  Resp    SpO2 94 % 03/06/2018  8:06 AM  Vitals shown include unvalidated device data.  Last Pain:  Vitals:   03/06/18 0536  TempSrc: Oral  PainSc:       Patients Stated Pain Goal: 10 (03/06/18 0512)

## 2018-03-06 NOTE — Addendum Note (Signed)
Addendum  created 03/06/18 1524 by Annye Asa, MD   Sign clinical note

## 2018-03-06 NOTE — Discharge Instructions (Signed)

## 2018-03-06 NOTE — Anesthesia Procedure Notes (Signed)
Procedure Name: LMA Insertion Date/Time: 03/06/2018 7:34 AM Performed by: Suan Halter, CRNA Pre-anesthesia Checklist: Patient identified, Emergency Drugs available, Suction available and Patient being monitored Patient Re-evaluated:Patient Re-evaluated prior to induction Oxygen Delivery Method: Circle system utilized Preoxygenation: Pre-oxygenation with 100% oxygen Induction Type: IV induction Ventilation: Mask ventilation without difficulty LMA: LMA inserted LMA Size: 4.0 Number of attempts: 1 Airway Equipment and Method: Bite block Placement Confirmation: positive ETCO2 Tube secured with: Tape Dental Injury: Teeth and Oropharynx as per pre-operative assessment

## 2018-03-06 NOTE — Consult Note (Signed)
Medical Consultation   Aaron Strong  IPJ:[id number]  DOB: 15-Jan-1951  DOA: 03/06/2018  PCP: [name], MD  Outpatient specialists:   Requesting physician: [name], urology  Reason for consultation: Hypoxia   History of Present Illness: 67 year old male with a history of prostate cancer, bladder cancer status post cystoscopy with bladder biopsy and fulguration by urology today.  Patient was unable to be weaned off of oxygen postoperatively.  He was noted to be hypoxic with oxygen saturations in the 80s.  TRH was consulted for hypoxia.  Currently patient denies any shortness of breath, chest pain, abdominal pain, nausea or vomiting, diarrhea or constipation, headache.  He does complain of dizziness.  Patient states he has a chronic cough.  He does endorse smoking history, has cut back from 3 packs/day to 1 pack/day.  Review of Systems:  All other systems reviewed and are negative.    Past Medical History: Past Medical History:  Diagnosis Date  . Arthritis    hands   . Bladder cancer [facility]) urologist-  dr winter   dx 01-16-2018 s/p  TURBT,  high grade T1c urothelial carcinoma with muscle involvement  . Coronary artery disease    per cardiac cath 2002 non-critical nonobstructive cad;  normal nuclear stress test in 2015  . Full dentures   . Heavy cigarette smoker    2ppd since age 31  . History of concussion    per pt 1990s w/ loc-- no residuals  . History of heavy alcohol consumption    01-10-2018  last alcohol use 2014  . Prostate cancer [facility]) urologist-- dr winter  (previouly dr grapey)-- 01-10-2018 per pt no recurrence   dx 2015--  Stage pT2c,  Gleason 6 and 7,  PSA 22--  02-18-2014  s/p  radical prostatectomy  . Urinary incontinence     Past Surgical History: Past Surgical History:  Procedure Laterality Date  . CARDIAC CATHETERIZATION  09-25-2000  [facility]   non-critical CAD ,  normal LVF (moderate diffuse disease along LAD w/ focal narrowing of mid 30-40%,  diffuse CFx 30%,  mRCA 60% prior to takeoff AV marginal branch remainder of vessel mild diffuse disease  . CARDIOVASCULAR STRESS TEST  02/12/2014   normal nuclear study w/ no ischemia or reversibility/ normal LV function and wall function, ef78%  . LYMPHADENECTOMY Bilateral 02/18/2014   Procedure: PELVIC LYMPH NODE DISSECTION;  Surgeon: [name], MD;  Location: [facility];  Service: Urology;  Laterality: Bilateral;  . ROBOT ASSISTED LAPAROSCOPIC RADICAL PROSTATECTOMY N/A 02/18/2014   Procedure: ROBOTIC ASSISTED LAPAROSCOPIC RADICAL PROSTATECTOMY;  Surgeon: [name], MD;  Location: [facility];  Service: Urology;  Laterality: N/A;  . TRANSTHORACIC ECHOCARDIOGRAM  02/12/2014   moderate focal basal and mid concentric LVH, ef 73-41%, grade 1 diastolic dysfunction/ trivial AR, MR, and PR/  mild TR  . TRANSURETHRAL RESECTION OF BLADDER TUMOR N/A 01/16/2018   Procedure: TRANSURETHRAL RESECTION OF BLADDER TUMOR (TURBT)/CYSTOSCOPY/;  Surgeon: [name], MD;  Location: [facility];  Service: Urology;  Laterality: N/A;     Allergies:  No Known Allergies  Social History:  reports that he has been smoking cigarettes.  He has a 80.00 pack-year smoking history. He has never used smokeless tobacco. He reports that he drank alcohol. He reports that he has current or past drug history. Drug: Marijuana.  Family History: Family History  Problem Relation Age of Onset  . Heart disease Father   . Heart attack Father   .  Heart attack Brother   . Heart disease Brother   . Heart attack Brother   . Heart disease Brother     Physical Exam: Blood pressure (!) 148/76, pulse 91, temperature 98.3 F (36.8 C), temperature source Oral, resp. rate 20, height 5\' 4"  (1.626 m), weight 81.8 kg (180 lb 6.4 oz), SpO2 92 %.   General: Well developed, well nourished, NAD, appears stated age  HEENT: NCAT, PERRLA, EOMI, Anicteic Sclera, mucous membranes moist.   Neck: Supple, no JVD, no  masses  Cardiovascular: S1 S2 auscultated, 1/6 SEM. Regular rate and rhythm.  Respiratory: Clear to auscultation bilaterally with equal chest rise  Abdomen: Soft, nontender, nondistended, + bowel sounds  Extremities: warm dry without cyanosis clubbing or edema  Neuro: AAOx3, cranial nerves grossly intact. Strength 5/5 in patient's upper and lower extremities bilaterally  Skin: Without rashes exudates or nodules  Psych: Normal affect and demeanor with intact judgement and insight  Data reviewed:  I have personally reviewed following labs and imaging studies Labs:  CBC: No results for input(s): WBC, NEUTROABS, HGB, HCT, MCV, PLT in the last 168 hours.  Basic Metabolic Panel: No results for input(s): NA, K, CL, CO2, GLUCOSE, BUN, CREATININE, CALCIUM, MG, PHOS in the last 168 hours. GFR CrCl cannot be calculated (Patient's most recent lab result is older than the maximum 21 days allowed.). Liver Function Tests: No results for input(s): AST, ALT, ALKPHOS, BILITOT, PROT, ALBUMIN in the last 168 hours. No results for input(s): LIPASE, AMYLASE in the last 168 hours. No results for input(s): AMMONIA in the last 168 hours. Coagulation profile No results for input(s): INR, PROTIME in the last 168 hours.  Cardiac Enzymes: No results for input(s): CKTOTAL, CKMB, CKMBINDEX, TROPONINI in the last 168 hours. BNP: Invalid input(s): POCBNP CBG: No results for input(s): GLUCAP in the last 168 hours. D-Dimer No results for input(s): DDIMER in the last 72 hours. Hgb A1c No results for input(s): HGBA1C in the last 72 hours. Lipid Profile No results for input(s): CHOL, HDL, LDLCALC, TRIG, CHOLHDL, LDLDIRECT in the last 72 hours. Thyroid function studies No results for input(s): TSH, T4TOTAL, T3FREE, THYROIDAB in the last 72 hours.  Invalid input(s): FREET3 Anemia work up No results for input(s): VITAMINB12, FOLATE, FERRITIN, TIBC, IRON, RETICCTPCT in the last 72 hours. Urinalysis      Component Value Date/Time   BILIRUBINUR NEG 01/03/2018 1414   PROTEINUR NEG 01/03/2018 1414   UROBILINOGEN 0.2 01/03/2018 1414   NITRITE NEG 01/03/2018 1414   LEUKOCYTESUR Negative 01/03/2018 1414    Sepsis Labs Invalid input(s): PROCALCITONIN,  WBC,  LACTICIDVEN Microbiology No results found for this or any previous visit (from the past 240 hour(s)).  Inpatient Medications:   Scheduled Meds: Continuous Infusions:  Radiological Exams on Admission: Dg Chest Port 1 View  Result Date: 03/06/2018 CLINICAL DATA:  Hypoxia. EXAM: PORTABLE CHEST 1 VIEW COMPARISON:  10/07/2013 FINDINGS: The heart size and pulmonary vascularity are normal. No infiltrates or effusions. Tiny area of atelectasis or scarring at the left base laterally. Aortic atherosclerosis. No acute bone abnormality. Old deformity of the midshaft of the right clavicle. Old left rib fractures. IMPRESSION: Tiny area of atelectasis or scarring at the left lung base. Aortic Atherosclerosis (ICD10-I70.0). Electronically Signed   By: Lorriane Shire M.D.   On: 03/06/2018 16:33    Impression/Recommendations  Hypoxia -Possibly secondary to recent surgery and anesthesia vs tobacco abuse -Chest x-ray obtained and shows atelectasis.  When compared to chest x-ray from 2015, not much change.  Unremarkable for infection. 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 -Will obtain an echocardiogram  Tobacco abuse -discussed smoking cessation -declined nicotine patch  Thank you for the consultation and allowing [place] to participate in the care and management of your patient. Will continue to follow  the patient with you.  Time Spent: 41 minutes  Naylani Bradner D.O. [facility] 03/06/2018, 5:50 PM

## 2018-03-06 NOTE — Anesthesia Postprocedure Evaluation (Addendum)
Anesthesia Post Note  Patient: Aaron Strong  Procedure(s) Performed: TRANSURETHRAL RESECTION OF BLADDER TUMOR (TURBT) (N/A ) CYSTOSCOPY WITH INSTILLATION OF MITOMYCIN (N/A )     Patient location during evaluation: PACU Anesthesia Type: General Level of consciousness: awake and alert, oriented and patient cooperative Pain management: pain level controlled Vital Signs Assessment: post-procedure vital signs reviewed and stable Respiratory status: spontaneous breathing, nonlabored ventilation, respiratory function stable and patient connected to nasal cannula oxygen Cardiovascular status: blood pressure returned to baseline and stable Postop Assessment: no apparent nausea or vomiting Anesthetic complications: no Comments: Pt with severe COPD, required hospital admission s/p last urologic procedure as well, for failure to wean from [place] O2. Pt again requiring [place] O2 to keep sats in 90's, dropping to low 80's off of supplemental O2. He has fully recovered from his brief anesthetic, and despite incentive spirometry, neb treatment, ambulation, and deep breathing he requires supplemental O2. Dr. Dema Severin is admitting him for hospitalist consultation, and I have communicated with pt's internist, Dr. Uvaldo Bristle, as well.     Last Vitals:  Vitals:   03/06/18 0815 03/06/18 0830  BP: (!) 169/79 (!) 150/76  Pulse: 93 83  Resp: 17 15  Temp:    SpO2: 97% 97%    Last Pain:  Vitals:   03/06/18 0915  TempSrc:   PainSc: 0-No pain                 Mahlon Gabrielle,E. Yohance Hathorne

## 2018-03-06 NOTE — Telephone Encounter (Signed)
Dr. Rogelio Seen from [facility] called patient wanted you to know that he is being admitted for [place] (TURBT). Dr. Rogelio Seen said to check your [place] email that she has sent you a message.

## 2018-03-06 NOTE — Op Note (Signed)
Operative Note  Preoperative diagnosis:  1.  High-grade T1 urothelial carcinoma of the bladder 2.  History of prostate cancer s/p RALP  Postoperative diagnosis: 1.  High-grade T1 urothelial carcinoma of the bladder 2.  History of prostate cancer s/p RALP  Procedure(s): 1.  Cystoscopy with bladder biopsy and fulguration 2.  Intravesical instillation of mitomycin-C  Surgeon: [name], MD  Assistants: None  Anesthesia: General  Complications: None  EBL: Less than 5 mL  Specimens: 1.  Superficial and deep right lateral bladder wall biopsies  Drains/Catheters: 1.  16 French Gauthier catheter was removed following mitomycin C instillation for a total of 60 minutes  Intraoperative findings:   1. There was an obvious scar adjacent to the right ureteral orifice from his previous TURBT, but no obvious papillary tumor recurrence was seen throughout the bladder.  The previous scar was biopsied with superficial and deep specimens.  The biopsy area was extensively fulgurated until stable hemostasis was achieved  Indication:  Aaron Strong is a 67 y.o. male with a history of pT2c, pN0, Mx Gleason 4+3 prostate cancer, s/p RALP in 2015 with Dr. [name]. He was recently evaluated by Dr. [name] for on-going urinary incontinence following his prostatectomy and was found to have a small papillary bladder lesion adjacent to the right ureteral orifice concerning for UCC on flexible cystoscopy in the office. He does have a long time to pack per day smoking history. No personal/family history of GU malignancies, aside from his prostate cancer.   He is s/p TURBT on 01/16/18. He is urinating without difficulty and denies interval urinary tract infections, dysuria or hematuria.   Pathology: High grade T1 UCC. Muscle was present in both the superficial and deep specimens and was uninvolved with tumor.   CT Urogram (11/09/17)- demonstrated his bladder mass, with no evidence of metastatic or upper  tract disease   Description of procedure:  After informed consent was obtained, the patient was brought to the operating room and general LMA anesthesia was administered. The patient was then placed in the dorsolithotomy position and prepped and draped in usual sterile fashion. A timeout was performed. A 23 French rigid cystoscope was then inserted into the urethral meatus and advanced into the bladder under direct vision. A complete bladder survey revealed no intravesical pathology.  The scar of his previous resection site from his initial TURBT was identified, there was no evidence of papillary tumor recurrence within the margins of the scar.  A cold cup biopsy was then taken within the previous resection site with superficial and deep specimens obtained.  The biopsied area was then extensively fulgurated until hemostasis was achieved.  The cystoscope was then removed and a 16 French Storr catheter was placed.  The patient tolerated the procedure well and was transferred to the postanesthesia in stable condition.  Instillation of mitomycin-C in PACU: 40 mg of mitomycin-C was instilled per urethra into the bladder and left to dwell for a total of 60 minutes.  After 60 minutes, the mitomycin-C was drained and the catheter was removed.  The patient was voiding on his own and was discharged home in stable condition.  Plan: Follow-up in 2 weeks to discuss pathology results

## 2018-03-06 NOTE — Addendum Note (Signed)
Addendum  created 03/06/18 1057 by Annye Asa, MD   Order list changed

## 2018-03-06 NOTE — Progress Notes (Signed)
Diet ordered for patient

## 2018-03-07 ENCOUNTER — Encounter (HOSPITAL_BASED_OUTPATIENT_CLINIC_OR_DEPARTMENT_OTHER): Payer: Self-pay | Admitting: Urology

## 2018-03-07 ENCOUNTER — Observation Stay (HOSPITAL_BASED_OUTPATIENT_CLINIC_OR_DEPARTMENT_OTHER): Payer: Medicare HMO

## 2018-03-07 DIAGNOSIS — J449 Chronic obstructive pulmonary disease, unspecified: Secondary | ICD-10-CM | POA: Diagnosis not present

## 2018-03-07 DIAGNOSIS — I361 Nonrheumatic tricuspid (valve) insufficiency: Secondary | ICD-10-CM | POA: Diagnosis not present

## 2018-03-07 DIAGNOSIS — I2510 Atherosclerotic heart disease of native coronary artery without angina pectoris: Secondary | ICD-10-CM | POA: Diagnosis not present

## 2018-03-07 DIAGNOSIS — Z8546 Personal history of malignant neoplasm of prostate: Secondary | ICD-10-CM | POA: Diagnosis not present

## 2018-03-07 DIAGNOSIS — C679 Malignant neoplasm of bladder, unspecified: Secondary | ICD-10-CM | POA: Diagnosis not present

## 2018-03-07 DIAGNOSIS — R0902 Hypoxemia: Secondary | ICD-10-CM | POA: Diagnosis not present

## 2018-03-07 DIAGNOSIS — Z9079 Acquired absence of other genital organ(s): Secondary | ICD-10-CM | POA: Diagnosis not present

## 2018-03-07 DIAGNOSIS — Z720 Tobacco use: Secondary | ICD-10-CM | POA: Diagnosis not present

## 2018-03-07 DIAGNOSIS — F17210 Nicotine dependence, cigarettes, uncomplicated: Secondary | ICD-10-CM | POA: Diagnosis not present

## 2018-03-07 DIAGNOSIS — R011 Cardiac murmur, unspecified: Secondary | ICD-10-CM | POA: Diagnosis not present

## 2018-03-07 LAB — ECHOCARDIOGRAM COMPLETE
HEIGHTINCHES: 64 in
WEIGHTICAEL: 2886.4 [oz_av]

## 2018-03-07 LAB — HIV ANTIBODY (ROUTINE TESTING W REFLEX): HIV SCREEN 4TH GENERATION: NONREACTIVE

## 2018-03-07 MED ORDER — PERFLUTREN LIPID MICROSPHERE
1.0000 mL | INTRAVENOUS | Status: AC | PRN
  Administered 2018-03-07: 3 mL via INTRAVENOUS
  Filled 2018-03-07: qty 10

## 2018-03-07 MED ORDER — PERFLUTREN LIPID MICROSPHERE
INTRAVENOUS | Status: AC
  Filled 2018-03-07: qty 10

## 2018-03-07 NOTE — Progress Notes (Signed)
Remains in hospital at time of post op phone call. Will try again tomorrow

## 2018-03-07 NOTE — Progress Notes (Signed)
1 Day Post-Op Subjective: NAEO.  Voiding w/o difficulty.  Denies SOB this AM.  O2 sats WNL on supplemental [place].   Objective: Vital signs in last 24 hours: Temp:  [97.8 F (36.6 C)-98.7 F (37.1 C)] 97.8 F (36.6 C) (06/27 0438) Pulse Rate:  [75-108] 75 (06/27 0438) Resp:  [14-34] 20 (06/27 0438) BP: (117-151)/(64-87) 148/79 (06/27 0438) SpO2:  [81 %-98 %] 98 % (06/27 0438)  Intake/Output from previous day: 06/26 0701 - 06/27 0700 In: 700 [I.V.:600; IV Piggyback:100] Out: 25 [Urine:25]  Intake/Output this shift: No intake/output data recorded.  Physical Exam:  General: Alert and oriented CV: RRR, palpable distal pulses Lungs: CTAB, equal chest rise Abdomen: Soft, NTND, no rebound or guarding Ext: NT, No erythema  Lab Results: No results for input(s): HGB, HCT in the last 72 hours. BMET No results for input(s): NA, K, CL, CO2, GLUCOSE, BUN, CREATININE, CALCIUM in the last 72 hours.   Studies/Results: Dg Chest Port 1 View  Result Date: 03/06/2018 CLINICAL DATA:  Hypoxia. EXAM: PORTABLE CHEST 1 VIEW COMPARISON:  10/07/2013 FINDINGS: The heart size and pulmonary vascularity are normal. No infiltrates or effusions. Tiny area of atelectasis or scarring at the left base laterally. Aortic atherosclerosis. No acute bone abnormality. Old deformity of the midshaft of the right clavicle. Old left rib fractures. IMPRESSION: Tiny area of atelectasis or scarring at the left lung base. Aortic Atherosclerosis (ICD10-I70.0). Electronically Signed   By: Lorriane Shire M.D.   On: 03/06/2018 16:33    Assessment/Plan:  POD1 s/p bladder biopsy.  Currently being monitored for post-op hypoxia, which is a chronic issue.  Hx of COPD and tobacco use.   -Awaiting arrangements for home O2.  Ok for d/c from a GU standpoint.    LOS: 0 days   [name], MD [facility] Pager: [phone]  03/07/2018, 9:19 AM

## 2018-03-07 NOTE — Progress Notes (Signed)
PROGRESS NOTE    Aaron Strong  JGG:[id number] DOB: 1951/07/15 DOA: 03/06/2018 PCP: [name], MD   Brief Narrative:  67 year old male with a history of prostate cancer, bladder cancer status post cystoscopy with bladder biopsy and fulguration by urology today.  Patient was unable to be weaned off of oxygen postoperatively.  He was noted to be hypoxic with oxygen saturations in the 80s.  TRH was consulted for hypoxia.  Assessment & Plan   ImagingResults(Last48hours)  Dg Chest Port 1 View  Result Date: 03/06/2018 CLINICAL DATA:  Hypoxia. EXAM: PORTABLE CHEST 1 VIEW COMPARISON:  10/07/2013 FINDINGS: The heart size and pulmonary vascularity are normal. No infiltrates or effusions. Tiny area of atelectasis or scarring at the left base laterally. Aortic atherosclerosis. No acute bone abnormality. Old deformity of the midshaft of the right clavicle. Old left rib fractures. IMPRESSION: Tiny area of atelectasis or scarring at the left lung base. Aortic Atherosclerosis (ICD10-I70.0). Electronically Signed   By: Lorriane Shire M.D.   On: 03/06/2018 16:33     Impression/Recommendations  Hypoxia -Possibly secondary to recent surgery and anesthesia vs tobacco abuse -Chest x-ray obtained and shows atelectasis.  When compared to chest x-ray from 2015, not much change.  Unremarkable for infection. -Continue supplemental oxygen to maintain saturations over 92% -Continue incentive spirometry -Oxygen saturations on room air at rest 94%, however upon ambulating, oxygen saturations dropped to 87%. 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 -Echocardiogram  obtained  Tobacco abuse -discussed smoking cessation -declined nicotine patch  DVT Prophylaxis SCDs  Code Status: Full  Family Communication: None at bedside  Disposition Plan: Observation.  Disposition per primary, Dr. Dema Severin, urology.  From a medical standpoint, patient is stable for discharge with 2 L of home oxygen.  Orders have been placed.  Patient may follow-up with his primary care physician for results of echocardiogram if he is discharged prior to read.  Consultants TRH  Procedures  Echocardiogram  Antibiotics   Anti-infectives (From admission, onward)   Start     Dose/Rate Route Frequency Ordered Stop   03/06/18 0545  ceFAZolin (ANCEF) IVPB 2g/100 mL premix     2 g 200 mL/hr over 30 Minutes Intravenous  Once 03/06/18 0532 03/06/18 0734   03/06/18 0000  sulfamethoxazole-trimethoprim (BACTRIM DS,SEPTRA DS) 800-160 MG tablet     1 tablet Oral 2 times daily 03/06/18 0758 03/09/18 2359      Subjective:   [name] seen and examined today.  Patient denies shortness of breath, chest pain, abdominal pain, nausea vomiting, diarrhea constipation, dizziness or headache.    Objective:   Vitals:   03/06/18 1612 03/06/18 2154 03/07/18 0438 03/07/18 1200  BP: (!) 148/76 139/86 (!) 148/79 (!) 159/77  Pulse: 91 98 75 77  Resp: 20 20 20  (!) 45  Temp: 98.3 F (36.8 C) 98 F (36.7 C) 97.8 F (36.6 C) 98.5 F (36.9 C)  TempSrc: Oral Oral Oral Oral  SpO2: 92% 95% 98% 94%  Weight:      Height:        Intake/Output Summary (Last 24 hours) at 03/07/2018 1438 Last data filed at 03/07/2018 1220 Gross per 24 hour  Intake -  Output 250  ml  Net -250 ml   Filed Weights   02/27/18 1646 03/06/18 0536  Weight: 77.1 kg (170 lb) 81.8 kg (180 lb 6.4 oz)    Exam  General: Well developed, well nourished, NAD, appears stated age  58: NCAT, mucous membranes moist.   Neck: Supple  Cardiovascular: S1 S2 auscultated, 1/6 SEM, RRR  Respiratory: Clear to auscultation  bilaterally with equal chest rise  Abdomen: Soft, nontender, nondistended, + bowel sounds  Extremities: warm dry without cyanosis clubbing or edema  Neuro: AAOx3, nonfocal  Skin: Without rashes exudates or nodules  Psych: Normal affect and demeanor with intact judgement and insight   Data Reviewed: I have personally reviewed following labs and imaging studies  CBC: No results for input(s): WBC, NEUTROABS, HGB, HCT, MCV, PLT in the last 168 hours. Basic Metabolic Panel: No results for input(s): NA, K, CL, CO2, GLUCOSE, BUN, CREATININE, CALCIUM, MG, PHOS in the last 168 hours. GFR: CrCl cannot be calculated (Patient's most recent lab result is older than the maximum 21 days allowed.). Liver Function Tests: No results for input(s): AST, ALT, ALKPHOS, BILITOT, PROT, ALBUMIN in the last 168 hours. No results for input(s): LIPASE, AMYLASE in the last 168 hours. No results for input(s): AMMONIA in the last 168 hours. Coagulation Profile: No results for input(s): INR, PROTIME in the last 168 hours. Cardiac Enzymes: No results for input(s): CKTOTAL, CKMB, CKMBINDEX, TROPONINI in the last 168 hours. BNP (last 3 results) No results for input(s): PROBNP in the last 8760 hours. HbA1C: No results for input(s): HGBA1C in the last 72 hours. CBG: No results for input(s): GLUCAP in the last 168 hours. Lipid Profile: No results for input(s): CHOL, HDL, LDLCALC, TRIG, CHOLHDL, LDLDIRECT in the last 72 hours. Thyroid Function Tests: No results for input(s): TSH, T4TOTAL, FREET4, T3FREE, THYROIDAB in the last 72 hours. Anemia Panel: No results for input(s): VITAMINB12, FOLATE, FERRITIN, TIBC, IRON, RETICCTPCT in the last 72 hours. Urine analysis:    Component Value Date/Time   BILIRUBINUR NEG 01/03/2018 1414   PROTEINUR NEG 01/03/2018 1414   UROBILINOGEN 0.2 01/03/2018 1414   NITRITE NEG 01/03/2018 1414   LEUKOCYTESUR Negative 01/03/2018 1414   Sepsis  Labs: @LABRCNTIP (procalcitonin:4,lacticidven:4)  )No results found for this or any previous visit (from the past 240 hour(s)).    Radiology Studies: Dg Chest Port 1 View  Result Date: 03/06/2018 CLINICAL DATA:  Hypoxia. EXAM: PORTABLE CHEST 1 VIEW COMPARISON:  10/07/2013 FINDINGS: The heart size and pulmonary vascularity are normal. No infiltrates or effusions. Tiny area of atelectasis or scarring at the left base laterally. Aortic atherosclerosis. No acute bone abnormality. Old deformity of the midshaft of the right clavicle. Old left rib fractures. IMPRESSION: Tiny area of atelectasis or scarring at the left lung base. Aortic Atherosclerosis (ICD10-I70.0). Electronically Signed   By: Lorriane Shire M.D.   On: 03/06/2018 16:33     Scheduled Meds: Continuous Infusions:   LOS: 0 days   Time Spent in minutes   30 minutes  Jakyri Brunkhorst D.O. on 03/07/2018 at 2:38 PM  Between 7am to 7pm - Pager - [phone]  After 7pm go to [email or web address] - password [email or web address]  And look for the night coverage person covering for me after hours  [facility] Office  [phone]

## 2018-03-07 NOTE — Progress Notes (Signed)
SATURATION QUALIFICATIONS: (This note is used to comply with regulatory documentation for home oxygen)  Patient Saturations on Room Air at Rest = 94%  Patient Saturations on Room Air while Ambulating = 87%  Patient Saturations on 2 Liters of oxygen while Ambulating = 91%  Please briefly explain why patient needs home oxygen:  Patient desats to 80s with activity on room air;  Recovers to 91% on 2L O2

## 2018-03-07 NOTE — Progress Notes (Signed)
  Echocardiogram 2D Echocardiogram with Definity has been performed.  Darlina Sicilian M 03/07/2018, 10:43 AM

## 2018-03-08 DIAGNOSIS — F17210 Nicotine dependence, cigarettes, uncomplicated: Secondary | ICD-10-CM | POA: Diagnosis not present

## 2018-03-08 DIAGNOSIS — I2510 Atherosclerotic heart disease of native coronary artery without angina pectoris: Secondary | ICD-10-CM | POA: Diagnosis not present

## 2018-03-08 DIAGNOSIS — J449 Chronic obstructive pulmonary disease, unspecified: Secondary | ICD-10-CM | POA: Diagnosis not present

## 2018-03-08 DIAGNOSIS — R0902 Hypoxemia: Secondary | ICD-10-CM | POA: Diagnosis not present

## 2018-03-08 DIAGNOSIS — Z8546 Personal history of malignant neoplasm of prostate: Secondary | ICD-10-CM | POA: Diagnosis not present

## 2018-03-08 DIAGNOSIS — Z9079 Acquired absence of other genital organ(s): Secondary | ICD-10-CM | POA: Diagnosis not present

## 2018-03-08 DIAGNOSIS — Z720 Tobacco use: Secondary | ICD-10-CM | POA: Diagnosis not present

## 2018-03-08 DIAGNOSIS — C679 Malignant neoplasm of bladder, unspecified: Secondary | ICD-10-CM | POA: Diagnosis not present

## 2018-03-08 DIAGNOSIS — R011 Cardiac murmur, unspecified: Secondary | ICD-10-CM | POA: Diagnosis not present

## 2018-03-08 NOTE — Care Management Note (Signed)
Case Management Note  Patient Details  Name: Aaron Strong MRN: [id number] Date of Birth: [birth year]  Subjective/Objective: Hypoxia, Bladder Cancer                   Action/Plan:Pt discharging home with Home O2.   Expected Discharge Date:  03/07/18               Expected Discharge Plan:  Home/Self Care  In-House Referral:     Discharge planning Services  CM Consult  Post Acute Care Choice:    Choice offered to:  Patient  DME Arranged:  Oxygen DME Agency:  [place]:    [facility] Agency:     Status of Service:  Completed, signed off  If discussed at [place] of Stay Meetings, dates discussed:    Additional CommentsPurcell Mouton, RN 03/08/2018, 10:02 AM

## 2018-03-08 NOTE — Progress Notes (Signed)
Pt to be discharged to home today. Discharge teaching/Instructions including Medications gone over with Pt. Pt verbalized  understanding of all discharge teaching. Pt to be discharged with Oxygen at 2l. Pt verbalized understanding of how to use oxygen.

## 2018-03-08 NOTE — Progress Notes (Signed)
PROGRESS NOTE    Aaron Strong  TSV:[id number] DOB: 1951/01/02 DOA: 03/06/2018 PCP: [name], MD   Brief Narrative:  67 year old male with a history of prostate cancer, bladder cancer status post cystoscopy with bladder biopsy and fulguration by urology today.  Patient was unable to be weaned off of oxygen postoperatively.  He was noted to be hypoxic with oxygen saturations in the 80s.  [facility] was consulted for hypoxia.  Assessment & Plan   Hypoxia -Possibly secondary to recent surgery and anesthesia vs tobacco abuse -Chest x-ray obtained and shows atelectasis.  When compared to chest x-ray from 2015, not much change.  Unremarkable for infection. -Continue supplemental oxygen to maintain saturations over 92% -Continue incentive spirometry -Oxygen saturations on room air at rest 94%, however upon ambulating, oxygen saturations dropped to 87%. 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 -Echocardiogram showed EF 65-70%, G2DD, trivial AR/MR/TR -Patient may follow up with his PCP and if there is a need for cardiology referral that can be made as an outpatient. Otherwise, no further recommendations  Tobacco abuse -discussed smoking cessation -declined nicotine patch  DVT Prophylaxis SCDs  Code Status: Full  Family Communication: None at bedside  Disposition Plan: Observation.  Disposition per primary, Dr. Dema Severin, urology.   Have placed order from [facility] Home O2. Patient medically stable for discharge from medicine standpoint. [facility] will sign off. Thank you for allowing [place] to  participate in the care and management of your patient.   Consultants [facility]  Procedures  Echocardiogram  Antibiotics   Anti-infectives (From admission, onward)   Start     Dose/Rate Route Frequency Ordered Stop   03/06/18 0545  ceFAZolin (ANCEF) IVPB 2g/100 mL premix     2 g 200 mL/hr over 30 Minutes Intravenous  Once 03/06/18 0532 03/06/18 0734   03/06/18 0000  sulfamethoxazole-trimethoprim (BACTRIM DS,SEPTRA DS) 800-160 MG tablet     1 tablet Oral 2 times daily 03/06/18 0758 03/09/18 2359      Subjective:   Aaron Strong seen and examined today. No complaints this morning. Would like to go home. Denies chest pain, shortness of breath, abdominal pain, N/V, dizziness, headache.   Objective:   Vitals:   03/07/18 0438 03/07/18 1200 03/07/18 2125 03/08/18 0601  BP: (!) 148/79 (!) 159/77 (!) 148/82 127/81  Pulse: 75 77 93 86  Resp: 20 (!) 45 (!) 22 (!) 22  Temp: 97.8 F (36.6 C) 98.5 F (36.9 C) 97.6 F (36.4 C) 98 F (36.7 C)  TempSrc: Oral Oral Oral Oral  SpO2: 98% 94% 95% 91%  Weight:      Height:        Intake/Output Summary (Last 24 hours) at 03/08/2018 0748 Last data filed at 03/08/2018 0600 Gross per 24 hour  Intake 420 ml  Output 1225 ml  Net -805 ml   Filed Weights   02/27/18 1646 03/06/18 0536  Weight: 77.1 kg (170 lb) 81.8 kg (180 lb 6.4 oz)   Exam  General: Well developed, well nourished, NAD, appears stated age  HEENT: NCAT, mucous membranes moist.   Neck: Supple  Cardiovascular: S1 S2 auscultated, soft SEM,  RRR  Respiratory: Clear to auscultation bilaterally with equal chest rise  Abdomen: Soft, nontender, nondistended, + bowel sounds  Extremities: warm dry without cyanosis clubbing or edema  Neuro: AAOx3, nonfocal  Psych: Normal affect and demeanor with intact judgement and insight  Data Reviewed: I have personally reviewed following labs and imaging studies  CBC: No results for input(s): WBC, NEUTROABS, HGB, HCT, MCV, PLT in the last  168 hours. Basic Metabolic Panel: No results for input(s): NA, K, CL, CO2, GLUCOSE, BUN, CREATININE, CALCIUM, MG, PHOS in the last 168 hours. GFR: CrCl cannot be calculated (Patient's most recent lab result is older than the maximum 21 days allowed.). Liver Function Tests: No results for input(s): AST, ALT, ALKPHOS, BILITOT, PROT, ALBUMIN in the last 168 hours. No results for input(s): LIPASE, AMYLASE in the last 168 hours. No results for input(s): AMMONIA in the last 168 hours. Coagulation Profile: No results for input(s): INR, PROTIME in the last 168 hours. Cardiac Enzymes: No results for input(s): CKTOTAL, CKMB, CKMBINDEX, TROPONINI in the last 168 hours. BNP (last 3 results) No results for input(s): PROBNP in the last 8760 hours. HbA1C: No results for input(s): HGBA1C in the last 72 hours. CBG: No results for input(s): GLUCAP in the last 168 hours. Lipid Profile: No results for input(s): CHOL, HDL, LDLCALC, TRIG, CHOLHDL, LDLDIRECT in the last 72 hours. Thyroid Function Tests: No results for input(s): TSH, T4TOTAL, FREET4, T3FREE, THYROIDAB in the last 72 hours. Anemia Panel: No results for input(s): VITAMINB12, FOLATE, FERRITIN, TIBC, IRON, RETICCTPCT in the last 72 hours. Urine analysis:    Component Value Date/Time   BILIRUBINUR NEG 01/03/2018 1414   PROTEINUR NEG 01/03/2018 1414   UROBILINOGEN 0.2 01/03/2018 1414   NITRITE NEG 01/03/2018 1414   LEUKOCYTESUR Negative 01/03/2018 1414   Sepsis Labs: @LABRCNTIP (procalcitonin:4,lacticidven:4)  )No results found for this or any previous visit (from the past 240 hour(s)).    Radiology Studies: Dg Chest Port 1 View  Result Date: 03/06/2018 CLINICAL DATA:  Hypoxia. EXAM: PORTABLE CHEST 1 VIEW COMPARISON:  10/07/2013 FINDINGS: The heart size and pulmonary vascularity are normal. No infiltrates or effusions. Tiny area of atelectasis or scarring at the left base laterally. Aortic atherosclerosis. No acute bone abnormality. Old  deformity of the midshaft of the right clavicle. Old left rib fractures. IMPRESSION: Tiny area of atelectasis or scarring at the left lung base. Aortic Atherosclerosis (ICD10-I70.0). Electronically Signed   By: Lorriane Shire M.D.   On: 03/06/2018 16:33     Scheduled Meds: Continuous Infusions:   LOS: 0 days   Time Spent in minutes   30 minutes  Giannah Zavadil D.O. on 03/08/2018 at 7:48 AM  Between 7am to 7pm - Pager - [phone]  After 7pm go to [email or web address] - password [email or web address]  And look for the night coverage person covering for me after hours  [facility] Office  [phone]

## 2018-03-15 DIAGNOSIS — C672 Malignant neoplasm of lateral wall of bladder: Secondary | ICD-10-CM | POA: Diagnosis not present

## 2018-03-15 DIAGNOSIS — C61 Malignant neoplasm of prostate: Secondary | ICD-10-CM | POA: Diagnosis not present

## 2018-03-17 NOTE — Discharge Summary (Signed)
Date of admission: 03/06/2018  Date of discharge: 03/17/2018  Admission diagnosis: History of high grade T1 urothelial carcinoma of the bladder COPD with hypoxia  Discharge diagnosis: Same    History and Physical: For full details, please see admission history and physical. Briefly, Aaron Strong is a 67 y.o. year old patient with HG T1 UCC of the bladder and is s/p cystoscopy with bladder biopsy on 03/06/18.  Post-op the patient was hypoxic with an otherwise negative pulmonary work-up for acute exacerbating issues.  He was placed on observation until home O2 arrangements could be made.   Hospital Course: The patient was monitored on the floor with no acute events.   Physical Exam:  General: Alert and oriented CV: RRR, palpable distal pulses Lungs: CTAB, equal chest rise Abdomen: Soft, NTND, no rebound or guarding Ext: NT, No erythema  Laboratory values: No results for input(s): HGB, HCT in the last 72 hours. No results for input(s): CREATININE in the last 72 hours.  Disposition: Home  Discharge instruction: The patient was instructed to be ambulatory but told to refrain from heavy lifting, strenuous activity, or driving.  Discharge medications:  Allergies as of 03/08/2018   No Known Allergies     Medication List    TAKE these medications   ondansetron 4 MG tablet Commonly known as:  ZOFRAN Take 1 tablet (4 mg total) by mouth daily as needed for nausea or vomiting.   oxybutynin 5 MG tablet Commonly known as:  DITROPAN Take 1 tablet (5 mg total) by mouth every 8 (eight) hours as needed for bladder spasms.   phenazopyridine 200 MG tablet Commonly known as:  PYRIDIUM Take 1 tablet (200 mg total) by mouth 3 (three) times daily as needed (for dysuria).     ASK your doctor about these medications   sulfamethoxazole-trimethoprim 800-160 MG tablet Commonly known as:  BACTRIM DS,SEPTRA DS Take 1 tablet by mouth 2 (two) times daily for 3 days. Ask about: Should I take this  medication?       Followup:  Follow-up Information    [name], MD In 2 weeks.   Specialty:  Urology Why:  Discuss pathology results Contact information: [place] [address] [phone]        [name], MD In 2 weeks.   Specialty:  Internal Medicine Why:  To review echocardiogram results Contact information: [address] [place] [address] [phone]

## 2018-03-18 ENCOUNTER — Ambulatory Visit
Admission: RE | Admit: 2018-03-18 | Discharge: 2018-03-18 | Disposition: A | Discharge status: Home / Self Care | Payer: Medicare HMO | Source: Ambulatory Visit | Attending: Internal Medicine | Admitting: Internal Medicine

## 2018-03-18 ENCOUNTER — Encounter: Payer: Self-pay | Admitting: Internal Medicine

## 2018-03-18 ENCOUNTER — Ambulatory Visit (INDEPENDENT_AMBULATORY_CARE_PROVIDER_SITE_OTHER): Payer: Medicare HMO | Admitting: Internal Medicine

## 2018-03-18 VITALS — BP 110/70 | HR 110 | Temp 98.1°F | Ht 63.0 in | Wt 176.0 lb

## 2018-03-18 DIAGNOSIS — R0602 Shortness of breath: Secondary | ICD-10-CM | POA: Diagnosis not present

## 2018-03-18 DIAGNOSIS — J449 Chronic obstructive pulmonary disease, unspecified: Secondary | ICD-10-CM

## 2018-03-18 DIAGNOSIS — J984 Other disorders of lung: Secondary | ICD-10-CM | POA: Diagnosis not present

## 2018-03-18 DIAGNOSIS — I82431 Acute embolism and thrombosis of right popliteal vein: Secondary | ICD-10-CM

## 2018-03-18 DIAGNOSIS — M79604 Pain in right leg: Secondary | ICD-10-CM

## 2018-03-18 DIAGNOSIS — M7989 Other specified soft tissue disorders: Secondary | ICD-10-CM | POA: Diagnosis not present

## 2018-03-18 LAB — COMPLETE METABOLIC PANEL WITH GFR
AG RATIO: 1.4 (calc) (ref 1.0–2.5)
ALBUMIN MSPROF: 4.2 g/dL (ref 3.6–5.1)
ALT: 14 U/L (ref 9–46)
AST: 17 U/L (ref 10–35)
Alkaline phosphatase (APISO): 125 U/L — ABNORMAL HIGH (ref 40–115)
BILIRUBIN TOTAL: 0.5 mg/dL (ref 0.2–1.2)
BUN: 11 mg/dL (ref 7–25)
CALCIUM: 9.8 mg/dL (ref 8.6–10.3)
CO2: 31 mmol/L (ref 20–32)
Chloride: 99 mmol/L (ref 98–110)
Creat: 0.89 mg/dL (ref 0.70–1.25)
GFR, EST AFRICAN AMERICAN: 102 mL/min/{1.73_m2} (ref >=60)
GFR, EST NON AFRICAN AMERICAN: 88 mL/min/{1.73_m2} (ref >=60)
Globulin: 3 g/dL (calc) (ref 1.9–3.7)
Glucose, Bld: 105 mg/dL — ABNORMAL HIGH (ref 65–99)
Potassium: 4.6 mmol/L (ref 3.5–5.3)
Sodium: 140 mmol/L (ref 135–146)
TOTAL PROTEIN: 7.2 g/dL (ref 6.1–8.1)

## 2018-03-18 LAB — CBC WITH DIFFERENTIAL/PLATELET
BASOS PCT: 0.6 %
Basophils Absolute: 86 cells/uL (ref 0–200)
EOS ABS: 403 {cells}/uL (ref 15–500)
Eosinophils Relative: 2.8 %
HCT: 47.9 % (ref 38.5–50.0)
HEMOGLOBIN: 16.3 g/dL (ref 13.2–17.1)
Lymphs Abs: 3125 cells/uL (ref 850–3900)
MCH: 30.1 pg (ref 27.0–33.0)
MCHC: 34 g/dL (ref 32.0–36.0)
MCV: 88.4 fL (ref 80.0–100.0)
MPV: 10.4 fL (ref 7.5–12.5)
Monocytes Relative: 9.8 %
NEUTROS ABS: 9374 {cells}/uL — AB (ref 1500–7800)
Neutrophils Relative %: 65.1 %
PLATELETS: 334 10*3/uL (ref 140–400)
RBC: 5.42 10*6/uL (ref 4.20–5.80)
RDW: 13.3 % (ref 11.0–15.0)
TOTAL LYMPHOCYTE: 21.7 %
WBC: 14.4 10*3/uL — AB (ref 3.8–10.8)
WBCMIX: 1411 {cells}/uL — AB (ref 200–950)

## 2018-03-18 MED ORDER — LEVOFLOXACIN 500 MG PO TABS: 500.0000 mg | ORAL_TABLET | Freq: Every day | ORAL | 0 refills | Status: DC

## 2018-03-18 NOTE — Progress Notes (Signed)
   Subjective:    Patient ID: Aaron Strong, male    DOB: Oct 28, 1950, 67 y.o.   MRN: [id number]  HPI 67 year old Male admitted by [name], Urologist June 26 through June 28, He had cystoscopy, transurethral resection of bladder tumor and bladder biopsy for bladder carcinoma June 26th. He developed oxygen desat postop and was admitted and observed.   Here today for follow up. O2 sat today is 96% on room air. He is a lonstanding heavy smoker and has hx of COPD but usually does not have recurrent episodes of SOB. 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   General health was good. He is a recovering alcoholic. Never drank anymore after diagnosis of prostate cancer and radical prostatectomy by Dr. [name] in 2015.   Accompanied today by friend, Aaron Strong. Has been coughing. No fever or shaking chills. CXR today is negative. CBC drawn and pending. Has developed redness right leg. Had small abraded area and picked scab off. May have cellulitis but also considering DVT. Is to have Doppler study.  Smokes about 1.25 ppd.  SHX: single never married. Rents home . Works for [employer] as a Animator.  Family WN:[id number] died of cancer ?type. Mother died at age 42 of old age. 3 sisters. @ brother with hx of MIs.  Hx ventral hernia.  Remote hx clavicle fracture. Had cardiac evaluation before surgery in 2015 with negative Myoview study.      Review of Systems see above,     Objective:   Physical Exam Right leg is erythematous and warm to touch.  Abraded area noted right medial leg.  Homans sign is negative.  Pulses and feet are good.  No lower extremity edema.  Chest clear.  Cardiac exam regular rate and rhythm normal S1 and S2.       Assessment & Plan:  History of bladder cancer with recent TURP by [name]  Status post radical prostatectomy for prostate cancer  History of smoking and COPD  Cellulitis right lower extremity versus DVT.  Start Levaquin 500 mg daily.  Have  chest x-ray and lower extremity Doppler.  CBC with differential drawn.  Addendum: March 19, 2018.  Received call from Vascular lab that he has a right lower extremity DVT.  Advised patient go to the Emergency department to be considered for admission.  He is really not able to give himself Lovenox at home.  He needs close monitoring.  He lives alone.  No relatives to help him.  Needs anticoagulation.

## 2018-03-19 ENCOUNTER — Other Ambulatory Visit: Payer: Self-pay

## 2018-03-19 ENCOUNTER — Observation Stay (HOSPITAL_COMMUNITY)
Admission: EM | Admit: 2018-03-19 | Discharge: 2018-03-21 | Disposition: A | Discharge status: Home / Self Care | Payer: Medicare HMO | Attending: Internal Medicine | Admitting: Internal Medicine

## 2018-03-19 ENCOUNTER — Emergency Department (HOSPITAL_COMMUNITY): Payer: Medicare HMO

## 2018-03-19 ENCOUNTER — Ambulatory Visit (HOSPITAL_BASED_OUTPATIENT_CLINIC_OR_DEPARTMENT_OTHER)
Admission: RE | Admit: 2018-03-19 | Discharge: 2018-03-19 | Disposition: A | Discharge status: Home / Self Care | Payer: Medicare HMO | Source: Ambulatory Visit | Attending: Internal Medicine | Admitting: Internal Medicine

## 2018-03-19 ENCOUNTER — Encounter (HOSPITAL_COMMUNITY): Payer: Self-pay | Admitting: Emergency Medicine

## 2018-03-19 DIAGNOSIS — Z9889 Other specified postprocedural states: Secondary | ICD-10-CM | POA: Insufficient documentation

## 2018-03-19 DIAGNOSIS — C679 Malignant neoplasm of bladder, unspecified: Secondary | ICD-10-CM | POA: Diagnosis not present

## 2018-03-19 DIAGNOSIS — Z8550 Personal history of malignant neoplasm of unspecified urinary tract organ: Secondary | ICD-10-CM | POA: Diagnosis not present

## 2018-03-19 DIAGNOSIS — I700 Atherosclerosis of aorta: Secondary | ICD-10-CM | POA: Insufficient documentation

## 2018-03-19 DIAGNOSIS — M79604 Pain in right leg: Secondary | ICD-10-CM | POA: Diagnosis not present

## 2018-03-19 DIAGNOSIS — F1021 Alcohol dependence, in remission: Secondary | ICD-10-CM | POA: Diagnosis not present

## 2018-03-19 DIAGNOSIS — IMO0001 Reserved for inherently not codable concepts without codable children: Secondary | ICD-10-CM | POA: Diagnosis present

## 2018-03-19 DIAGNOSIS — I2699 Other pulmonary embolism without acute cor pulmonale: Secondary | ICD-10-CM | POA: Diagnosis not present

## 2018-03-19 DIAGNOSIS — R5383 Other fatigue: Secondary | ICD-10-CM | POA: Diagnosis not present

## 2018-03-19 DIAGNOSIS — Z9079 Acquired absence of other genital organ(s): Secondary | ICD-10-CM | POA: Diagnosis not present

## 2018-03-19 DIAGNOSIS — Z79899 Other long term (current) drug therapy: Secondary | ICD-10-CM | POA: Diagnosis not present

## 2018-03-19 DIAGNOSIS — I82431 Acute embolism and thrombosis of right popliteal vein: Secondary | ICD-10-CM | POA: Diagnosis not present

## 2018-03-19 DIAGNOSIS — F17200 Nicotine dependence, unspecified, uncomplicated: Secondary | ICD-10-CM | POA: Diagnosis present

## 2018-03-19 DIAGNOSIS — M19041 Primary osteoarthritis, right hand: Secondary | ICD-10-CM | POA: Diagnosis not present

## 2018-03-19 DIAGNOSIS — I82441 Acute embolism and thrombosis of right tibial vein: Secondary | ICD-10-CM | POA: Insufficient documentation

## 2018-03-19 DIAGNOSIS — J439 Emphysema, unspecified: Secondary | ICD-10-CM | POA: Insufficient documentation

## 2018-03-19 DIAGNOSIS — M7989 Other specified soft tissue disorders: Secondary | ICD-10-CM

## 2018-03-19 DIAGNOSIS — M19042 Primary osteoarthritis, left hand: Secondary | ICD-10-CM | POA: Insufficient documentation

## 2018-03-19 DIAGNOSIS — C678 Malignant neoplasm of overlapping sites of bladder: Secondary | ICD-10-CM | POA: Diagnosis not present

## 2018-03-19 DIAGNOSIS — R0602 Shortness of breath: Secondary | ICD-10-CM | POA: Diagnosis not present

## 2018-03-19 DIAGNOSIS — F17210 Nicotine dependence, cigarettes, uncomplicated: Secondary | ICD-10-CM | POA: Insufficient documentation

## 2018-03-19 DIAGNOSIS — I2510 Atherosclerotic heart disease of native coronary artery without angina pectoris: Secondary | ICD-10-CM | POA: Diagnosis not present

## 2018-03-19 DIAGNOSIS — J449 Chronic obstructive pulmonary disease, unspecified: Secondary | ICD-10-CM

## 2018-03-19 DIAGNOSIS — Z8249 Family history of ischemic heart disease and other diseases of the circulatory system: Secondary | ICD-10-CM | POA: Diagnosis not present

## 2018-03-19 DIAGNOSIS — Z66 Do not resuscitate: Secondary | ICD-10-CM | POA: Insufficient documentation

## 2018-03-19 DIAGNOSIS — R05 Cough: Secondary | ICD-10-CM | POA: Diagnosis not present

## 2018-03-19 DIAGNOSIS — R32 Unspecified urinary incontinence: Secondary | ICD-10-CM | POA: Diagnosis not present

## 2018-03-19 LAB — BASIC METABOLIC PANEL
ANION GAP: 13 (ref 5–15)
BUN: 9 mg/dL (ref 8–23)
CALCIUM: 9.4 mg/dL (ref 8.9–10.3)
CO2: 27 mmol/L (ref 22–32)
Chloride: 99 mmol/L (ref 98–111)
Creatinine, Ser: 0.92 mg/dL (ref 0.61–1.24)
GFR calc Af Amer: >60 mL/min (ref >60)
GLUCOSE: 99 mg/dL (ref 70–99)
Potassium: 4.6 mmol/L (ref 3.5–5.1)
Sodium: 139 mmol/L (ref 135–145)

## 2018-03-19 LAB — HEPARIN LEVEL (UNFRACTIONATED): HEPARIN UNFRACTIONATED: 0.55 [IU]/mL (ref 0.30–0.70)

## 2018-03-19 LAB — BRAIN NATRIURETIC PEPTIDE: B NATRIURETIC PEPTIDE 5: 50.7 pg/mL (ref 0.0–100.0)

## 2018-03-19 LAB — CBC
HEMATOCRIT: 49.3 % (ref 39.0–52.0)
HEMOGLOBIN: 15.5 g/dL (ref 13.0–17.0)
MCH: 29.1 pg (ref 26.0–34.0)
MCHC: 31.4 g/dL (ref 30.0–36.0)
MCV: 92.5 fL (ref 78.0–100.0)
Platelets: 312 10*3/uL (ref 150–400)
RBC: 5.33 MIL/uL (ref 4.22–5.81)
RDW: 13.4 % (ref 11.5–15.5)
WBC: 13.2 10*3/uL — AB (ref 4.0–10.5)

## 2018-03-19 LAB — TROPONIN I: Troponin I: <0.03 ng/mL (ref <0.03)

## 2018-03-19 MED ORDER — ACETAMINOPHEN 650 MG RE SUPP: 650.0000 mg | Freq: Four times a day (QID) | RECTAL | Status: DC | PRN

## 2018-03-19 MED ORDER — LEVOFLOXACIN 500 MG PO TABS: 500.0000 mg | ORAL_TABLET | Freq: Every day | ORAL | Status: DC

## 2018-03-19 MED ORDER — HEPARIN BOLUS VIA INFUSION
5000.0000 [IU] | Freq: Once | INTRAVENOUS | Status: AC
  Administered 2018-03-19: 5000 [IU] via INTRAVENOUS
  Filled 2018-03-19: qty 5000

## 2018-03-19 MED ORDER — ACETAMINOPHEN 325 MG PO TABS: 650.0000 mg | ORAL_TABLET | Freq: Four times a day (QID) | ORAL | Status: DC | PRN

## 2018-03-19 MED ORDER — ORAL CARE MOUTH RINSE
15.0000 mL | Freq: Two times a day (BID) | OROMUCOSAL | Status: DC
  Administered 2018-03-20 – 2018-03-21 (×2): 15 mL via OROMUCOSAL

## 2018-03-19 MED ORDER — DOCUSATE SODIUM 100 MG PO CAPS
100.0000 mg | ORAL_CAPSULE | Freq: Two times a day (BID) | ORAL | Status: DC
  Administered 2018-03-19 – 2018-03-21 (×4): 100 mg via ORAL
  Filled 2018-03-19 (×4): qty 1

## 2018-03-19 MED ORDER — NICOTINE 14 MG/24HR TD PT24
14.0000 mg | MEDICATED_PATCH | Freq: Every day | TRANSDERMAL | Status: DC
  Administered 2018-03-19 – 2018-03-21 (×3): 14 mg via TRANSDERMAL
  Filled 2018-03-19 (×3): qty 1

## 2018-03-19 MED ORDER — IOPAMIDOL (ISOVUE-370) INJECTION 76%
INTRAVENOUS | Status: AC
  Filled 2018-03-19: qty 100

## 2018-03-19 MED ORDER — KETOTIFEN FUMARATE 0.025 % OP SOLN
1.0000 [drp] | Freq: Two times a day (BID) | OPHTHALMIC | Status: DC
  Administered 2018-03-19 – 2018-03-21 (×4): 1 [drp] via OPHTHALMIC
  Filled 2018-03-19: qty 5

## 2018-03-19 MED ORDER — IOPAMIDOL (ISOVUE-370) INJECTION 76%
100.0000 mL | Freq: Once | INTRAVENOUS | Status: AC | PRN
  Administered 2018-03-19: 100 mL via INTRAVENOUS

## 2018-03-19 MED ORDER — ONDANSETRON HCL 4 MG/2ML IJ SOLN: 4.0000 mg | Freq: Four times a day (QID) | INTRAMUSCULAR | Status: DC | PRN

## 2018-03-19 MED ORDER — HEPARIN (PORCINE) IN NACL 100-0.45 UNIT/ML-% IJ SOLN
1250.0000 [IU]/h | INTRAMUSCULAR | Status: DC
  Administered 2018-03-19: 1200 [IU]/h via INTRAVENOUS
  Administered 2018-03-20 – 2018-03-21 (×2): 1250 [IU]/h via INTRAVENOUS
  Filled 2018-03-19 (×3): qty 250

## 2018-03-19 MED ORDER — ONDANSETRON HCL 4 MG PO TABS
4.0000 mg | ORAL_TABLET | Freq: Four times a day (QID) | ORAL | Status: DC | PRN
Start: 2018-03-19 — End: 2018-03-21

## 2018-03-19 NOTE — ED Provider Notes (Signed)
[facility] EMERGENCY DEPARTMENT Provider Note   CSN: [id number] Arrival date & time: 03/19/18  1008     History   Chief Complaint Chief Complaint  Patient presents with  . Leg Swelling    positive for DVT    HPI Aaron Strong is a 67 y.o. male.  HPI 67 year old male with history of bladder cancer status post recent TURP by [name] with intravesicular chemotherapy, here with right leg swelling.  Patient states that shortly after his surgery, during which she was briefly admitted for postoperative hypoxia, he began to develop right leg swelling.  He had some mild redness of the leg as well.  He has had associated intermittent cramping pain that is worse with bearing weight.  Is also had persistent shortness of breath ever since the surgery.  Has had increased cough but no hemoptysis or sputum production.  No fevers.  He went to his PCP yesterday and was sent for ultrasound which showed a popliteal and distal clot in the right extremity.  Subsequently sent here for evaluation.  States is been recovering well from his prostate surgery with only minimal hematuria.  No fevers or chills.  No history of blood clots.  No history of pulmonary embolism.  Denies any chest pain, only endorses shortness of breath is worse with exertion, not at rest.  Past Medical History:  Diagnosis Date  . Arthritis    hands   . Bladder cancer [facility]) urologist-  dr winter   dx 01-16-2018 s/p  TURBT,  high grade T1c urothelial carcinoma with muscle involvement  . Coronary artery disease    per cardiac cath 2002 non-critical nonobstructive cad;  normal nuclear stress test in 2015  . Full dentures   . Heavy cigarette smoker    2ppd since age 89  . History of concussion    per pt 1990s w/ loc-- no residuals  . History of heavy alcohol consumption    01-10-2018  last alcohol use 2014  . Prostate cancer [facility]) urologist-- dr winter  (previouly dr grapey)-- 01-10-2018 per pt no recurrence   dx  2015--  Stage pT2c,  Gleason 6 and 7,  PSA 22--  02-18-2014  s/p  radical prostatectomy  . Urinary incontinence     Patient Active Problem List   Diagnosis Date Noted  . Bladder cancer ([place]) 03/06/2018  . Recovering alcoholic ([place]) 26/33/3545  . Prostate CA ([place]) 02/03/2014  . History of COPD 10/07/2013  . Smoking 10/07/2013    Past Surgical History:  Procedure Laterality Date  . CARDIAC CATHETERIZATION  09-25-2000  [facility]   non-critical CAD ,  normal LVF (moderate diffuse disease along LAD w/ focal narrowing of mid 30-40%, diffuse CFx 30%,  mRCA 60% prior to takeoff AV marginal branch remainder of vessel mild diffuse disease  . CARDIOVASCULAR STRESS TEST  02/12/2014   normal nuclear study w/ no ischemia or reversibility/ normal LV function and wall function, ef78%  . CYSTOSCOPY N/A 03/06/2018   Procedure: CYSTOSCOPY WITH INSTILLATION OF MITOMYCIN;  Surgeon: [name], MD;  Location: [facility];  Service: Urology;  Laterality: N/A;  . LYMPHADENECTOMY Bilateral 02/18/2014   Procedure: PELVIC LYMPH NODE DISSECTION;  Surgeon: [name], MD;  Location: [facility];  Service: Urology;  Laterality: Bilateral;  . ROBOT ASSISTED LAPAROSCOPIC RADICAL PROSTATECTOMY N/A 02/18/2014   Procedure: ROBOTIC ASSISTED LAPAROSCOPIC RADICAL PROSTATECTOMY;  Surgeon: [name], MD;  Location: [facility];  Service: Urology;  Laterality: N/A;  .  TRANSTHORACIC ECHOCARDIOGRAM  02/12/2014   moderate focal basal and mid concentric LVH, ef 17-91%, grade 1 diastolic dysfunction/ trivial AR, MR, and PR/  mild TR  . TRANSURETHRAL RESECTION OF BLADDER TUMOR N/A 01/16/2018   Procedure: TRANSURETHRAL RESECTION OF BLADDER TUMOR (TURBT)/CYSTOSCOPY/;  Surgeon: [name], MD;  Location: [facility];  Service: Urology;  Laterality: N/A;   . TRANSURETHRAL RESECTION OF BLADDER TUMOR N/A 03/06/2018   Procedure: TRANSURETHRAL RESECTION OF BLADDER TUMOR (TURBT);  Surgeon:  [name], MD;  Location: [facility];  Service: Urology;  Laterality: N/A;  ONLY NEEDS 45 MIN FOR ALL PROCEDURES        Home Medications    Prior to Admission medications   Medication Sig Start Date End Date Taking? Authorizing Provider  levofloxacin (LEVAQUIN) 500 MG tablet Take 1 tablet (500 mg total) by mouth daily. 03/18/18  Yes Baxley, Cresenciano Lick, MD  ondansetron (ZOFRAN) 4 MG tablet Take 1 tablet (4 mg total) by mouth daily as needed for nausea or vomiting. Patient not taking: Reported on 03/18/2018 03/06/18 03/06/19  [name], MD  oxybutynin (DITROPAN) 5 MG tablet Take 1 tablet (5 mg total) by mouth every 8 (eight) hours as needed for bladder spasms. Patient not taking: Reported on 03/18/2018 03/06/18   [name], MD  phenazopyridine (PYRIDIUM) 200 MG tablet Take 1 tablet (200 mg total) by mouth 3 (three) times daily as needed (for dysuria). Patient not taking: Reported on 03/18/2018 01/16/18 01/16/19  [name], MD    Family History Family History  Problem Relation Age of Onset  . Heart disease Father   . Heart attack Father   . Heart attack Brother   . Heart disease Brother   . Heart attack Brother   . Heart disease Brother     Social History Social History   Tobacco Use  . Smoking status: Current Every Day Smoker    Packs/day: 2.00    Years: 40.00    Pack years: 80.00    Types: Cigarettes  . Smokeless tobacco: Never Used  Substance Use Topics  . Alcohol use: Not Currently    Comment: hx heavy alcohol use;  01-10-2018  per pt quit drinking 2014  . Drug use: Not Currently    Types: Marijuana    Comment: hx of marijuana -- 01-10-2018 per pt last used 02-2013     Allergies   Patient has no known allergies.   Review of Systems Review of Systems  Constitutional: Positive for fatigue. Negative for chills and fever.  HENT: Negative for congestion and rhinorrhea.   Eyes: Negative for visual  disturbance.  Respiratory: Positive for cough and shortness of breath. Negative for wheezing.   Cardiovascular: Positive for leg swelling. Negative for chest pain.  Gastrointestinal: Negative for abdominal pain, diarrhea, nausea and vomiting.  Genitourinary: Negative for dysuria and flank pain.  Musculoskeletal: Negative for neck pain and neck stiffness.  Skin: Negative for rash and wound.  Allergic/Immunologic: Negative for immunocompromised state.  Neurological: Positive for weakness. Negative for syncope and headaches.  All other systems reviewed and are negative.    Physical Exam Updated Vital Signs BP (!) 174/94   Pulse 90   Temp 98.1 F (36.7 C) (Oral)   Resp (!) 30   Ht 5\' 3"  (1.6 m)   Wt 83.5 kg (184 lb)   SpO2 92%   BMI 32.59 kg/m   Physical Exam  Constitutional: He is oriented to person, place, and time.  He appears well-developed and well-nourished. No distress.  HENT:  Head: Normocephalic and atraumatic.  Eyes: Conjunctivae are normal.  Neck: Neck supple.  Cardiovascular: Normal rate, regular rhythm and normal heart sounds. Exam reveals no friction rub.  No murmur heard. Pulmonary/Chest: Effort normal and breath sounds normal. No respiratory distress. He has no wheezes. He has no rales.  Abdominal: He exhibits no distension.  Musculoskeletal: He exhibits no edema.  Mild edema, asymmetric, of the right lower extremity with mild erythema extending from the ankle to the mid lower leg.  No palpable cords.  No popliteal tenderness.  2+ DP and PT pulses bilaterally.  Normal sensation to light touch.  Neurological: He is alert and oriented to person, place, and time. He exhibits normal muscle tone.  Skin: Skin is warm. Capillary refill takes less than 2 seconds.  Psychiatric: He has a normal mood and affect.  Nursing note and vitals reviewed.    ED Treatments / Results  Labs (all labs ordered are listed, but only abnormal results are displayed) Labs Reviewed  CBC  - Abnormal; Notable for the following components:      Result Value   WBC 13.2 (*)    All other components within normal limits  BASIC METABOLIC PANEL  TROPONIN I  BRAIN NATRIURETIC PEPTIDE    EKG None  Radiology Dg Chest 2 View  Result Date: 03/18/2018 CLINICAL DATA:  Dyspnea EXAM: CHEST - 2 VIEW COMPARISON:  03/06/2018 chest radiograph. FINDINGS: Stable cardiomediastinal silhouette with normal heart size. No pneumothorax. No pleural effusion. No pulmonary edema. No acute consolidative airspace disease. Stable mild scarring in the lingula, as seen on 11/09/2017 CT abdomen study. IMPRESSION: No active cardiopulmonary disease. Electronically Signed   By: Ilona Sorrel M.D.   On: 03/18/2018 13:23   Ct Angio Chest Pe W Or Wo Contrast  Result Date: 03/19/2018 CLINICAL DATA:  Right leg swelling, DVT EXAM: CT ANGIOGRAPHY CHEST WITH CONTRAST TECHNIQUE: Multidetector CT imaging of the chest was performed using the standard protocol during bolus administration of intravenous contrast. Multiplanar CT image reconstructions and MIPs were obtained to evaluate the vascular anatomy. CONTRAST:  158mL ISOVUE-370 IOPAMIDOL (ISOVUE-370) INJECTION 76% COMPARISON:  Chest radiographs dated 03/18/2018 FINDINGS: Cardiovascular: Satisfactory opacification the bilateral pulmonary arteries to the segmental level. However, there is respiratory motion which significantly constraints evaluation. Segmental right lower lobe pulmonary emboli (coronal image 102) are present. Possible subsegmental left lower lobe pulmonary emboli (series 6/image 165), although this appearance is less convincing and may be related to motion artifact. Overall clot burden is small. No evidence of right heart strain. No evidence of thoracic aortic aneurysm or dissection. Atherosclerotic calcifications of the aortic arch. The heart is normal in size.  No pericardial effusion. Three vessel coronary atherosclerosis. Mediastinum/Nodes: No suspicious  mediastinal lymphadenopathy. Visualized thyroid is unremarkable. Lungs/Pleura: Evaluation lung parenchyma is constrained by respiratory motion. Within that constraint, there are no suspicious pulmonary nodules. Mild paraseptal emphysematous changes in the bilateral upper lobes. Mild scarring/atelectasis in the lingula, right middle lobe, and medial right lower lobe. No focal consolidation. No pleural effusion or pneumothorax. Upper Abdomen: Visualized upper abdomen is grossly unremarkable. Musculoskeletal: Degenerative changes of the thoracic spine. Review of the MIP images confirms the above findings. IMPRESSION: Segmental/subsegmental pulmonary emboli in branches of the right lower lobe and possibly the left lower lobe, as described above. Overall clot burden is small. No evidence of right heart strain. Critical Value/emergent results were called by telephone at the time of interpretation on 03/19/2018 at 3:11  pm to Dr. Duffy Bruce , who verbally acknowledged these results. Aortic Atherosclerosis (ICD10-I70.0) and Emphysema (ICD10-J43.9). Electronically Signed   By: Julian Hy M.D.   On: 03/19/2018 15:13    Procedures .Critical Care Performed by: Duffy Bruce, MD Authorized by: Duffy Bruce, MD   Critical care provider statement:    Critical care time (minutes):  35   Critical care time was exclusive of:  Separately billable procedures and treating other patients and teaching time   Critical care was necessary to treat or prevent imminent or life-threatening deterioration of the following conditions:  Cardiac failure, circulatory failure and respiratory failure   Critical care was time spent personally by me on the following activities:  Development of treatment plan with patient or surrogate, discussions with consultants, evaluation of patient's response to treatment, examination of patient, obtaining history from patient or surrogate, ordering and performing treatments and  interventions, ordering and review of laboratory studies, ordering and review of radiographic studies, pulse oximetry, re-evaluation of patient's condition and review of old charts   I assumed direction of critical care for this patient from another provider in my specialty: no     (including critical care time)  Medications Ordered in ED Medications  iopamidol (ISOVUE-370) 76 % injection (has no administration in time range)  iopamidol (ISOVUE-370) 76 % injection 100 mL (100 mLs Intravenous Contrast Given 03/19/18 1447)     Initial Impression / Assessment and Plan / ED Course  I have reviewed the triage vital signs and the nursing notes.  Pertinent labs & imaging results that were available during my care of the patient were reviewed by me and considered in my medical decision making (see chart for details).     67 year old male here with right leg swelling, positive DVT study as an outpatient.  Patient also with chest pain and tachypnea since his recent surgery.  Concern for pulmonary embolism, which is confirmed on CT angios.  She has normal troponin, normal BNP, no evidence of submassive PE.  Patient lives alone, has little support nearby.  Will start him on heparin currently, admit.  Patient also borderline hypoxic here.  Will notify urology as he had recent procedure.  Final Clinical Impressions(s) / ED Diagnoses   Final diagnoses:  Acute pulmonary embolism without acute cor pulmonale, unspecified pulmonary embolism type [facility])    ED Discharge Orders    None       Duffy Bruce, MD 03/19/18 [phone]

## 2018-03-19 NOTE — Patient Instructions (Signed)
To the emergency department for treatment of DVT.

## 2018-03-19 NOTE — Consult Note (Signed)
Urology Consult Note   Requesting Attending Physician:  Karmen Bongo, MD Service Providing Consult: Urology  Consulting Attending: Dr. Link Snuffer   Reason for Consult: Bladder cancer s/p recent biopsy  HPI: Aaron Strong is seen in consultation for reasons noted above at the request of Karmen Bongo, MD for evaluation of DVT/PE requiring anticoagulation in the setting of recent urologic procedure.  This is a 67 y.o. male with history of pT2cN0Mx Gleason 4+3 prostate cancer s/p RALP 2015 (most recent PSA undetectable) and HGT1 bladder cancer s/p recent cystoscopy with bladder biopsy 6/26 (benign path). His post-op course was c/b hypoxia requiring admission and use of supplemental oxygen.  Patient was seen by PCP yesterday with complaint of RLE swelling. An [place] at that time showed RLE DVT. He was sent to the ED for further workup where he was found to have PE.  He is now admitted to medicine service for management of DVT/PE and is currently on heparin gtt. He has no urinary complaints at this time. He specifically denies gross hematuria. Patient reports voiding with incontinence that is his baseline following his prostatectomy. He denies fevers/chills, nausea/vomiting. He reports some SOB prior to his presentation, but denies CP. He states that he was not using his oxygen at home.   Past Medical History: Past Medical History:  Diagnosis Date  . Arthritis    hands   . Bladder cancer [facility]) urologist-  dr winter   dx 01-16-2018 s/p  TURBT,  high grade T1c urothelial carcinoma with muscle involvement  . Coronary artery disease    per cardiac cath 2002 non-critical nonobstructive cad;  normal nuclear stress test in 2015  . Full dentures   . Heavy cigarette smoker    2ppd since age 43  . History of concussion    per pt 1990s w/ loc-- no residuals  . History of heavy alcohol consumption    01-10-2018  last alcohol use 2014  . Prostate cancer [facility]) urologist-- dr winter  (previouly dr  grapey)-- 01-10-2018 per pt no recurrence   dx 2015--  Stage pT2c,  Gleason 6 and 7,  PSA 22--  02-18-2014  s/p  radical prostatectomy  . Urinary incontinence     Past Surgical History:  Past Surgical History:  Procedure Laterality Date  . CARDIAC CATHETERIZATION  09-25-2000  [facility]   non-critical CAD ,  normal LVF (moderate diffuse disease along LAD w/ focal narrowing of mid 30-40%, diffuse CFx 30%,  mRCA 60% prior to takeoff AV marginal branch remainder of vessel mild diffuse disease  . CARDIOVASCULAR STRESS TEST  02/12/2014   normal nuclear study w/ no ischemia or reversibility/ normal LV function and wall function, ef78%  . CYSTOSCOPY N/A 03/06/2018   Procedure: CYSTOSCOPY WITH INSTILLATION OF MITOMYCIN;  Surgeon: [name], MD;  [facility];  Service: Urology;  Laterality: N/A;  . LYMPHADENECTOMY Bilateral 02/18/2014   Procedure: PELVIC LYMPH NODE DISSECTION;  Surgeon: [name], MD;  Location: [facility];  Service: Urology;  Laterality: Bilateral;  . ROBOT ASSISTED LAPAROSCOPIC RADICAL PROSTATECTOMY N/A 02/18/2014   Procedure: ROBOTIC ASSISTED LAPAROSCOPIC RADICAL PROSTATECTOMY;  Surgeon: [name], MD;  Location: [facility];  Service: Urology;  Laterality: N/A;  . TRANSTHORACIC ECHOCARDIOGRAM  02/12/2014   moderate focal basal and mid concentric LVH, ef 65-03%, grade 1 diastolic dysfunction/ trivial AR, MR, and PR/  mild TR  . TRANSURETHRAL RESECTION OF BLADDER TUMOR N/A 01/16/2018   Procedure: TRANSURETHRAL RESECTION OF BLADDER TUMOR (TURBT)/CYSTOSCOPY/;  Surgeon: [name], MD;  Location: [facility];  Service: Urology;  Laterality: N/A;   . TRANSURETHRAL RESECTION OF BLADDER TUMOR N/A 03/06/2018   Procedure: TRANSURETHRAL RESECTION OF BLADDER TUMOR (TURBT);  Surgeon: [name], MD;  Location: [facility];  Service: Urology;  Laterality: N/A;  ONLY NEEDS 45 MIN FOR ALL PROCEDURES     Medication: Current Facility-Administered Medications  Medication Dose Route Frequency Provider Last Rate Last Dose  . heparin ADULT infusion 100 units/mL (25000 units/225mL sodium chloride 0.45%)  1,200 Units/hr Intravenous Continuous Sloan Leiter B, RPH 12 mL/hr at 03/19/18 1541 1,200 Units/hr at 03/19/18 1541  . iopamidol (ISOVUE-370) 76 % injection            Current Outpatient Medications  Medication Sig Dispense Refill  . levofloxacin (LEVAQUIN) 500 MG tablet Take 1 tablet (500 mg total) by mouth daily. 7 tablet 0  . ondansetron (ZOFRAN) 4 MG tablet Take 1 tablet (4 mg total) by mouth daily as needed for nausea or vomiting. (Patient not taking: Reported on 03/18/2018) 30 tablet 1  . oxybutynin (DITROPAN) 5 MG tablet Take 1 tablet (5 mg total) by mouth every 8 (eight) hours as needed for bladder spasms. (Patient not taking: Reported on 03/18/2018) 30 tablet 1  . phenazopyridine (PYRIDIUM) 200 MG tablet Take 1 tablet (200 mg total) by mouth 3 (three) times daily as needed (for dysuria). (Patient not taking: Reported on 03/18/2018) 30 tablet 0    Allergies: No Known Allergies  Social History: Social History   Tobacco Use  . Smoking status: Former Smoker    Packs/day: 2.00    Years: 43.00    Pack years: 86.00    Types: Cigarettes  . Smokeless tobacco: Never Used  . Tobacco comment: quit in 6/19; wouldn't mind having a patch  Substance Use Topics  . Alcohol use: Not Currently    Comment: hx heavy alcohol use;  quit drinking 2014  . Drug use: Not Currently    Types: Marijuana    Comment: hx of marijuana --  last used 20+ years ago    Family History Family History  Problem Relation Age of Onset  . Heart disease Father   . Heart attack Father   . Heart attack Brother   . Heart disease Brother   . Heart attack Brother   . Heart disease Brother     Review of Systems 10 systems were reviewed and are negative except as noted specifically in the HPI.  Objective   Vital  signs in last 24 hours: BP (!) 155/88   Pulse 85   Temp 98.1 F (36.7 C) (Oral)   Resp (!) 24   Ht 5\' 3"  (1.6 m)   Wt 83.5 kg (184 lb)   SpO2 91%   BMI 32.59 kg/m   Physical Exam General: NAD, A&O, resting, appropriate HEENT: [place]/AT, EOMI, MMM Pulmonary: Normal work of breathing on [place] Cardiovascular: HDS, adequate peripheral perfusion Abdomen: Soft, NTTP, nondistended GU: No CVA tenderness. Voided urine clear yellow. Extremities: warm and well perfused Neuro: Appropriate, no focal neurological deficits  Most Recent Labs: Lab Results  Component Value Date   WBC 13.2 (H) 03/19/2018   HGB 15.5 03/19/2018   HCT 49.3 03/19/2018   PLT 312 03/19/2018    Lab Results  Component Value Date   NA 139 03/19/2018   K 4.6 03/19/2018   CL 99 03/19/2018   CO2 27 03/19/2018   BUN 9 03/19/2018   CREATININE 0.92  03/19/2018   CALCIUM 9.4 03/19/2018    No results found for: INR, APTT   IMAGING: Dg Chest 2 View  Result Date: 03/18/2018 CLINICAL DATA:  Dyspnea EXAM: CHEST - 2 VIEW COMPARISON:  03/06/2018 chest radiograph. FINDINGS: Stable cardiomediastinal silhouette with normal heart size. No pneumothorax. No pleural effusion. No pulmonary edema. No acute consolidative airspace disease. Stable mild scarring in the lingula, as seen on 11/09/2017 CT abdomen study. IMPRESSION: No active cardiopulmonary disease. Electronically Signed   By: Ilona Sorrel M.D.   On: 03/18/2018 13:23   Ct Angio Chest Pe W Or Wo Contrast  Result Date: 03/19/2018 CLINICAL DATA:  Right leg swelling, DVT EXAM: CT ANGIOGRAPHY CHEST WITH CONTRAST TECHNIQUE: Multidetector CT imaging of the chest was performed using the standard protocol during bolus administration of intravenous contrast. Multiplanar CT image reconstructions and MIPs were obtained to evaluate the vascular anatomy. CONTRAST:  16mL ISOVUE-370 IOPAMIDOL (ISOVUE-370) INJECTION 76% COMPARISON:  Chest radiographs dated 03/18/2018 FINDINGS: Cardiovascular:  Satisfactory opacification the bilateral pulmonary arteries to the segmental level. However, there is respiratory motion which significantly constraints evaluation. Segmental right lower lobe pulmonary emboli (coronal image 102) are present. Possible subsegmental left lower lobe pulmonary emboli (series 6/image 165), although this appearance is less convincing and may be related to motion artifact. Overall clot burden is small. No evidence of right heart strain. No evidence of thoracic aortic aneurysm or dissection. Atherosclerotic calcifications of the aortic arch. The heart is normal in size.  No pericardial effusion. Three vessel coronary atherosclerosis. Mediastinum/Nodes: No suspicious mediastinal lymphadenopathy. Visualized thyroid is unremarkable. Lungs/Pleura: Evaluation lung parenchyma is constrained by respiratory motion. Within that constraint, there are no suspicious pulmonary nodules. Mild paraseptal emphysematous changes in the bilateral upper lobes. Mild scarring/atelectasis in the lingula, right middle lobe, and medial right lower lobe. No focal consolidation. No pleural effusion or pneumothorax. Upper Abdomen: Visualized upper abdomen is grossly unremarkable. Musculoskeletal: Degenerative changes of the thoracic spine. Review of the MIP images confirms the above findings. IMPRESSION: Segmental/subsegmental pulmonary emboli in branches of the right lower lobe and possibly the left lower lobe, as described above. Overall clot burden is small. No evidence of right heart strain. Critical Value/emergent results were called by telephone at the time of interpretation on 03/19/2018 at 3:11 pm to Dr. Duffy Bruce , who verbally acknowledged these results. Aortic Atherosclerosis (ICD10-I70.0) and Emphysema (ICD10-J43.9). Electronically Signed   By: Julian Hy M.D.   On: 03/19/2018 15:13    ------  Assessment:  67 y.o. male with history of HGT1 bladder cancer s/p recent cystoscopy with bladder  biopsy on 6/26 (benign path), now admitted to medicine service for management of DVT/PE and is currently on heparin gtt. Voided urine clear yellow at this time. Patient reports voiding with incontinence that is his baseline.   Recommendations: - OK for anticoagulation from urologic perspective. - Patient will continue to follow with urology as outpatient for regular surveillance of his NMIBC and history of prostate cancer.   Thank you for this consult. Please contact the urology consult pager with any further questions/concerns.

## 2018-03-19 NOTE — ED Triage Notes (Signed)
Pt. Stated I had some swelling rt. Leg vascular study today. Positive for DVT rt. Leg.

## 2018-03-19 NOTE — H&P (Signed)
History and Physical    Aaron Strong KYH:[id number] DOB: December 10, 1950 DOA: 03/19/2018  PCP: [name], MD Consultants:  Lovena Neighbours - urology Patient coming from:  Home - lives with friend; NOK: Sister, [phone]  Chief Complaint: leg swelling  HPI: Aaron Strong is a 67 y.o. male with medical history significant of prostate CA; ETOH dependence, remote; heavy tobacco dependence; CAD; and recent bladder CA (grade T1c urothelial carcinoma with muscle involvement s/p TURBT 01/16/18) presenting with RLE edema.  He was discharged on 6/28 after bladder procedure.  He went home and woke up and his leg started swelling and he didn't understand it.  He went to the doctor the next day.  Then he saw his PCP yesterday and she imaged him and found a blood clot.  His DVT US was this AM and then he was sent to the ER.  He was discharged on O2 after having needed it before.  He wasn't using it at home because he wasn't feeling exhausted.  He did feel SOB today and CT shows PE.  ED Course:   DVT/PE.  Hypoxic post-op and worsening since.  Lives alone, is hypoxic.  Obs overnight.  Review of Systems: As per HPI; otherwise review of systems reviewed and negative.   Ambulatory Status:  Ambulates without assistance  Past Medical History:  Diagnosis Date  . Arthritis    hands   . Bladder cancer [facility]) urologist-  dr winter   dx 01-16-2018 s/p  TURBT,  high grade T1c urothelial carcinoma with muscle involvement  . Coronary artery disease    per cardiac cath 2002 non-critical nonobstructive cad;  normal nuclear stress test in 2015  . Full dentures   . Heavy cigarette smoker    2ppd since age 76  . History of concussion    per pt 1990s w/ loc-- no residuals  . History of heavy alcohol consumption    01-10-2018  last alcohol use 2014  . Prostate cancer [facility]) urologist-- dr winter  (previouly dr grapey)-- 01-10-2018 per pt no recurrence   dx 2015--  Stage pT2c,  Gleason 6 and 7,  PSA 22--  02-18-2014  s/p   radical prostatectomy  . Urinary incontinence     Past Surgical History:  Procedure Laterality Date  . CARDIAC CATHETERIZATION  09-25-2000  [facility]   non-critical CAD ,  normal LVF (moderate diffuse disease along LAD w/ focal narrowing of mid 30-40%, diffuse CFx 30%,  mRCA 60% prior to takeoff AV marginal branch remainder of vessel mild diffuse disease  . CARDIOVASCULAR STRESS TEST  02/12/2014   normal nuclear study w/ no ischemia or reversibility/ normal LV function and wall function, ef78%  . CYSTOSCOPY N/A 03/06/2018   Procedure: CYSTOSCOPY WITH INSTILLATION OF MITOMYCIN;  Surgeon: [name], MD;  Location: [facility];  Service: Urology;  Laterality: N/A;  . LYMPHADENECTOMY Bilateral 02/18/2014   Procedure: PELVIC LYMPH NODE DISSECTION;  Surgeon: [name], MD;  Location: [facility];  Service: Urology;  Laterality: Bilateral;  . ROBOT ASSISTED LAPAROSCOPIC RADICAL PROSTATECTOMY N/A 02/18/2014   Procedure: ROBOTIC ASSISTED LAPAROSCOPIC RADICAL PROSTATECTOMY;  Surgeon: [name], MD;  Location: [facility];  Service: Urology;  Laterality: N/A;  . TRANSTHORACIC ECHOCARDIOGRAM  02/12/2014   moderate focal basal and mid concentric LVH, ef 15-17%, grade 1 diastolic dysfunction/ trivial AR, MR, and PR/  mild TR  . TRANSURETHRAL RESECTION OF BLADDER TUMOR N/A 01/16/2018   Procedure: TRANSURETHRAL RESECTION OF BLADDER TUMOR (TURBT)/CYSTOSCOPY/;  Surgeon:  [name], MD;  Location: [facility];  Service: Urology;  Laterality: N/A;   . TRANSURETHRAL RESECTION OF BLADDER TUMOR N/A 03/06/2018   Procedure: TRANSURETHRAL RESECTION OF BLADDER TUMOR (TURBT);  Surgeon: [name], MD;  Location: [facility];  Service: Urology;  Laterality: N/A;  ONLY NEEDS 45 MIN FOR ALL PROCEDURES    Social History   Socioeconomic History  . Marital status: Single    Spouse name: Not on file  . Number of children: Not on file  . Years  of education: Not on file  . Highest education level: Not on file  Occupational History  . Occupation: maintenance at [employer]  . Financial resource strain: Not on file  . Food insecurity:    Worry: Not on file    Inability: Not on file  . Transportation needs:    Medical: Not on file    Non-medical: Not on file  Tobacco Use  . Smoking status: Former Smoker    Packs/day: 2.00    Years: 43.00    Pack years: 86.00    Types: Cigarettes  . Smokeless tobacco: Never Used  . Tobacco comment: quit in 6/19; wouldn't mind having a patch  Substance and Sexual Activity  . Alcohol use: Not Currently    Comment: hx heavy alcohol use;  quit drinking 2014  . Drug use: Not Currently    Types: Marijuana    Comment: hx of marijuana --  last used 20+ years ago  . Sexual activity: Not on file  Lifestyle  . Physical activity:    Days per week: Not on file    Minutes per session: Not on file  . Stress: Not on file  Relationships  . Social connections:    Talks on phone: Not on file    Gets together: Not on file    Attends religious service: Not on file    Active member of club or organization: Not on file    Attends meetings of clubs or organizations: Not on file    Relationship status: Not on file  . Intimate partner violence:    Fear of current or ex partner: Not on file    Emotionally abused: Not on file    Physically abused: Not on file    Forced sexual activity: Not on file  Other Topics Concern  . Not on file  Social History Narrative  . Not on file    No Known Allergies  Family History  Problem Relation Age of Onset  . Heart disease Father   . Heart attack Father   . Heart attack Brother   . Heart disease Brother   . Heart attack Brother   . Heart disease Brother     Prior to Admission medications   Medication Sig Start Date End Date Taking? Authorizing Provider  levofloxacin (LEVAQUIN) 500 MG tablet Take 1 tablet (500 mg total) by mouth  daily. 03/18/18  Yes Baxley, Cresenciano Lick, MD  ondansetron (ZOFRAN) 4 MG tablet Take 1 tablet (4 mg total) by mouth daily as needed for nausea or vomiting. Patient not taking: Reported on 03/18/2018 03/06/18 03/06/19  [name], MD  oxybutynin (DITROPAN) 5 MG tablet Take 1 tablet (5 mg total) by mouth every 8 (eight) hours as needed for bladder spasms. Patient not taking: Reported on 03/18/2018 03/06/18   [name], MD  phenazopyridine (PYRIDIUM) 200 MG tablet Take 1 tablet (200 mg total) by mouth 3 (  three) times daily as needed (for dysuria). Patient not taking: Reported on 03/18/2018 01/16/18 01/16/19  [name], MD    Physical Exam: Vitals:   03/19/18 1534 03/19/18 1545 03/19/18 1630 03/19/18 1719  BP: (!) 170/97 (!) 155/88 (!) 168/90 (!) 151/83  Pulse: 88 85 95 84  Resp: (!) 24   (!) 22  Temp:    (!) 97.4 F (36.3 C)  TempSrc:    Oral  SpO2: 92% 91% 97% 96%  Weight:      Height:         General:  Appears calm and comfortable and is NAD Eyes: PERRL, EOMI, normal lids, iris; injected conjunctivae ENT:  Hard of hearing, normal lips & tongue, mmm Neck:  no LAD, masses or thyromegaly; no carotid bruits Cardiovascular:  RRR, no m/r/g. No LE edema.  Respiratory:   CTA bilaterally with no wheezes/rales/rhonchi.  Normal respiratory effort. Abdomen:  soft, NT, ND, NABS Back:   normal alignment, no CVAT Skin:  no rash or induration seen on limited exam Musculoskeletal:  RLE edema with erythema of the calf, +Homan's, +squeeze Psychiatric:  grossly normal mood and affect, speech fluent and appropriate, AOx3 Neurologic:  CN 2-12 grossly intact, moves all extremities in coordinated fashion, sensation intact    Radiological Exams on Admission: Dg Chest 2 View  Result Date: 03/18/2018 CLINICAL DATA:  Dyspnea EXAM: CHEST - 2 VIEW COMPARISON:  03/06/2018 chest radiograph. FINDINGS: Stable cardiomediastinal silhouette with normal heart size. No pneumothorax. No  pleural effusion. No pulmonary edema. No acute consolidative airspace disease. Stable mild scarring in the lingula, as seen on 11/09/2017 CT abdomen study. IMPRESSION: No active cardiopulmonary disease. Electronically Signed   By: Ilona Sorrel M.D.   On: 03/18/2018 13:23   Ct Angio Chest Pe W Or Wo Contrast  Result Date: 03/19/2018 CLINICAL DATA:  Right leg swelling, DVT EXAM: CT ANGIOGRAPHY CHEST WITH CONTRAST TECHNIQUE: Multidetector CT imaging of the chest was performed using the standard protocol during bolus administration of intravenous contrast. Multiplanar CT image reconstructions and MIPs were obtained to evaluate the vascular anatomy. CONTRAST:  119mL ISOVUE-370 IOPAMIDOL (ISOVUE-370) INJECTION 76% COMPARISON:  Chest radiographs dated 03/18/2018 FINDINGS: Cardiovascular: Satisfactory opacification the bilateral pulmonary arteries to the segmental level. However, there is respiratory motion which significantly constraints evaluation. Segmental right lower lobe pulmonary emboli (coronal image 102) are present. Possible subsegmental left lower lobe pulmonary emboli (series 6/image 165), although this appearance is less convincing and may be related to motion artifact. Overall clot burden is small. No evidence of right heart strain. No evidence of thoracic aortic aneurysm or dissection. Atherosclerotic calcifications of the aortic arch. The heart is normal in size.  No pericardial effusion. Three vessel coronary atherosclerosis. Mediastinum/Nodes: No suspicious mediastinal lymphadenopathy. Visualized thyroid is unremarkable. Lungs/Pleura: Evaluation lung parenchyma is constrained by respiratory motion. Within that constraint, there are no suspicious pulmonary nodules. Mild paraseptal emphysematous changes in the bilateral upper lobes. Mild scarring/atelectasis in the lingula, right middle lobe, and medial right lower lobe. No focal consolidation. No pleural effusion or pneumothorax. Upper Abdomen:  Visualized upper abdomen is grossly unremarkable. Musculoskeletal: Degenerative changes of the thoracic spine. Review of the MIP images confirms the above findings. IMPRESSION: Segmental/subsegmental pulmonary emboli in branches of the right lower lobe and possibly the left lower lobe, as described above. Overall clot burden is small. No evidence of right heart strain. Critical Value/emergent results were called by telephone at the time of interpretation on 03/19/2018 at 3:11 pm to Dr. Duffy Bruce ,  who verbally acknowledged these results. Aortic Atherosclerosis (ICD10-I70.0) and Emphysema (ICD10-J43.9). Electronically Signed   By: Julian Hy M.D.   On: 03/19/2018 15:13    EKG: Independently reviewed.  NSR with rate 83; nonspecific ST changes with no evidence of acute ischemia   Labs on Admission: I have personally reviewed the available labs and imaging studies at the time of the admission.  Pertinent labs:   Normal BMP BNP 50.7 Troponin <0.03 WBC 13.2 Otherwise normal CBC   Assessment/Plan Principal Problem:   Pulmonary embolism (HCC) Active Problems:   Smoking   Bladder cancer (HCC)   PE -Patient with recent hospitalization and diagnosis of bladder cancer presenting with initial DVT and PE -He appears comfortable on [place] O2 and without tachycardia/tachypnea -No evidence of right heart strain on CT -Will observe on telemetry -Initiate anticoagulation - for now, will start Heparin drip -He is likely able to transition to alternative [facility] agent tomorrow -Coumadin may be his best option, since it is very inexpensive; however, it requires frequent physician visit -NOACs would also be a consideration but may be cost-prohibitive; CM consult requested to help with this issue -Smoking cessation has been strongly encouraged -The patient understands that thromboembolic disease can be catastrophic and even deadly and that he must be complaint with physician appointments and  anticoagulation.  Bladder cancer -S/p recent cystoscopy with bladder biopsy and fulguration  -As per urology -Has f/u appointment to discuss results in 1-2 weeks  Tobacco dependence -Recently stopped smoking -The importance of ongoing cessation was discussed -He requests a patch  DVT prophylaxis:  Heparin drip for now Code Status:  DNR - confirmed with patient Family Communication: None present Disposition Plan:  Home once clinically improved Consults called: None  Admission status: It is my clinical opinion that referral for OBSERVATION is reasonable and necessary in this patient based on the above information provided. The aforementioned taken together are felt to place the patient at high risk for further clinical deterioration. However it is anticipated that the patient may be medically stable for discharge from the hospital within 24 to 48 hours.    Karmen Bongo MD [facility]  If note is complete, please contact covering daytime or nighttime physician. [email or web address] Password [facility]  03/19/2018, 5:34 PM

## 2018-03-19 NOTE — Progress Notes (Addendum)
ANTICOAGULATION CONSULT NOTE - Follow-Up Consult  Pharmacy Consult for Heparin Indication: pulmonary embolus and DVT  No Known Allergies  Patient Measurements: Height: 5\' 3"  (160 cm) Weight: 184 lb (83.5 kg) IBW/kg (Calculated) : 56.9 Heparin Dosing Weight: 74.8 kg  Vital Signs: Temp: 97.4 F (36.3 C) (07/09 1719) Temp Source: Oral (07/09 1719) BP: 151/83 (07/09 1719) Pulse Rate: 84 (07/09 1719)  Labs: Recent Labs    03/18/18 1233 03/19/18 1213 03/19/18 2222  HGB 16.3 15.5  --   HCT 47.9 49.3  --   PLT 334 312  --   HEPARINUNFRC  --   --  0.55  CREATININE 0.89 0.92  --   TROPONINI  --  <0.03  --     Estimated Creatinine Clearance: 73.4 mL/min (by C-G formula based on SCr of 0.92 mg/dL).  Assessment: 67 year old male with a history of bladder cancer and recent TURP surgery presented to the ED with R-LE swelling and positive DVT study as outpatient. Patient was also noted to have chest pain and CT Angio resulted positive for PE- small clot burden and no evidence of R-heart strain. Patient was not on anticoagulants prior to admission. Pharmacy consulted to start Heparin for anticoagulation.   Heparin level this evening resulted as therapeutic (HL 0.55, goal of 0.3-0.7). No bleeding noted at this time. Will increase slightly to keep in the higher end of goal range as likely still seeing some residual bolus effect.   Goal of Therapy:  Heparin level 0.3-0.7 units/ml Monitor platelets by anticoagulation protocol: Yes   Plan:  - Increase Heparin slightly to 1250 units/hr (12.5 ml/hr) - Will continue to monitor for any signs/symptoms of bleeding and will follow up with heparin level in 6 hours to confirm therapeutic   Thank you for allowing pharmacy to be a part of this patient's care.  Alycia Rossetti, PharmD, BCPS Clinical Pharmacist Pager: [phone] 03/19/2018 10:51 PM     ---------------------------------------------------------------------------- Addendum:  Confirmatory heparin level this morning remains therapeutic (HL 0.52 << 0.55, goal of 0.3-0.7)  Plan - Continue Heparin at 1250 units/hr - Will continue to monitor for any signs/symptoms of bleeding and will follow up with heparin level in the a.m.   Alycia Rossetti, PharmD, BCPS Pager: [phone] 7:34 AM

## 2018-03-19 NOTE — Progress Notes (Signed)
Preliminary results by tech - Venous Duplex Right Lower Ext. Completed. Positive for acute deep vein thrombosis involving the popliteal vein, posterior tibial and peroneal veins. Results given to Dr. Renold Genta and patient was escorted to the ED. Oda Cogan, BS, RDMS, RVT

## 2018-03-19 NOTE — Progress Notes (Signed)
ANTICOAGULATION CONSULT NOTE - Initial Consult  Pharmacy Consult for Heparin Indication: pulmonary embolus and DVT  No Known Allergies  Patient Measurements: Height: 5\' 3"  (160 cm) Weight: 184 lb (83.5 kg) IBW/kg (Calculated) : 56.9 Heparin Dosing Weight: 74.8 kg  Vital Signs: Temp: 98.1 F (36.7 C) (07/09 1039) Temp Source: Oral (07/09 1039) BP: 174/94 (07/09 1445) Pulse Rate: 90 (07/09 1445)  Labs: Recent Labs    03/18/18 1233 03/19/18 1213  HGB 16.3 15.5  HCT 47.9 49.3  PLT 334 312  CREATININE 0.89 0.92  TROPONINI  --  <0.03    Estimated Creatinine Clearance: 73.4 mL/min (by C-G formula based on SCr of 0.92 mg/dL).   Medical History: Past Medical History:  Diagnosis Date  . Arthritis    hands   . Bladder cancer [facility]) urologist-  dr winter   dx 01-16-2018 s/p  TURBT,  high grade T1c urothelial carcinoma with muscle involvement  . Coronary artery disease    per cardiac cath 2002 non-critical nonobstructive cad;  normal nuclear stress test in 2015  . Full dentures   . Heavy cigarette smoker    2ppd since age 89  . History of concussion    per pt 1990s w/ loc-- no residuals  . History of heavy alcohol consumption    01-10-2018  last alcohol use 2014  . Prostate cancer [facility]) urologist-- dr winter  (previouly dr grapey)-- 01-10-2018 per pt no recurrence   dx 2015--  Stage pT2c,  Gleason 6 and 7,  PSA 22--  02-18-2014  s/p  radical prostatectomy  . Urinary incontinence     Medications:  Only taking Levaquin PTA - to stop 03/22/18.  Assessment: 67 year old male with a history of bladder cancer and recent TURP surgery presented to the ED with R-LE swelling and positive DVT study as outpatient. Patient was also noted to have chest pain and CT Angio resulted positive for PE- small clot burden and no evidence of R-heart strain. Patient was not on anticoagulants prior to admission. Patient is currently borderline hypoxic.   CBC is within normal limits. SCr is stable  at 0.92 with estimated CrCl ~ 70-75 mL/min. No bleeding reported.   Goal of Therapy:  Heparin level 0.3-0.7 units/ml Monitor platelets by anticoagulation protocol: Yes   Plan:  Heparin bolus of 5000 units x1. Start Heparin infusion at 1200 units/hr.  Check Heparin level 6 hours after initiation.  Daily Heparin level and CBC while on therapy.   Sloan Leiter, PharmD, BCPS, BCCCP Clinical Pharmacist Clinical phone 03/19/2018 until 11;30PM [phone] After hours, please call [phone] 03/19/2018,3:18 PM

## 2018-03-20 DIAGNOSIS — I2699 Other pulmonary embolism without acute cor pulmonale: Secondary | ICD-10-CM | POA: Diagnosis not present

## 2018-03-20 DIAGNOSIS — C679 Malignant neoplasm of bladder, unspecified: Secondary | ICD-10-CM | POA: Diagnosis not present

## 2018-03-20 DIAGNOSIS — I82409 Acute embolism and thrombosis of unspecified deep veins of unspecified lower extremity: Secondary | ICD-10-CM | POA: Diagnosis not present

## 2018-03-20 DIAGNOSIS — I2602 Saddle embolus of pulmonary artery with acute cor pulmonale: Secondary | ICD-10-CM | POA: Diagnosis not present

## 2018-03-20 DIAGNOSIS — F17200 Nicotine dependence, unspecified, uncomplicated: Secondary | ICD-10-CM | POA: Diagnosis not present

## 2018-03-20 LAB — BASIC METABOLIC PANEL
Anion gap: 11 (ref 5–15)
BUN: 9 mg/dL (ref 8–23)
CHLORIDE: 98 mmol/L (ref 98–111)
CO2: 30 mmol/L (ref 22–32)
CREATININE: 0.93 mg/dL (ref 0.61–1.24)
Calcium: 9 mg/dL (ref 8.9–10.3)
GFR calc Af Amer: >60 mL/min (ref >60)
GFR calc non Af Amer: >60 mL/min (ref >60)
GLUCOSE: 89 mg/dL (ref 70–99)
POTASSIUM: 4.6 mmol/L (ref 3.5–5.1)
SODIUM: 139 mmol/L (ref 135–145)

## 2018-03-20 LAB — PROTIME-INR
INR: 1.09
Prothrombin Time: 14 seconds (ref 11.4–15.2)

## 2018-03-20 LAB — CBC
HEMATOCRIT: 48.3 % (ref 39.0–52.0)
HEMOGLOBIN: 15 g/dL (ref 13.0–17.0)
MCH: 29.1 pg (ref 26.0–34.0)
MCHC: 31.1 g/dL (ref 30.0–36.0)
MCV: 93.6 fL (ref 78.0–100.0)
Platelets: 298 10*3/uL (ref 150–400)
RBC: 5.16 MIL/uL (ref 4.22–5.81)
RDW: 13.7 % (ref 11.5–15.5)
WBC: 12.8 10*3/uL — ABNORMAL HIGH (ref 4.0–10.5)

## 2018-03-20 LAB — HEPARIN LEVEL (UNFRACTIONATED): Heparin Unfractionated: 0.52 IU/mL (ref 0.30–0.70)

## 2018-03-20 NOTE — Progress Notes (Signed)
[facility] PROGRESS NOTE  Aaron Strong SAY:[id number] DOB: 12/14/1950 DOA: 03/19/2018   PCP: [name], MD     Assessment/Plan: Principal Problem:   Pulmonary embolism [facility]) Active Problems:   Smoking   Bladder cancer [facility])    67 y.o. male with medical history significant of prostate CA; ETOH dependence, remote; heavy tobacco dependence; CAD; and recent bladder CA (grade T1c urothelial carcinoma with muscle involvement s/p TURBT 01/16/18) presenting with RLE edema.  He was discharged on 6/28 after bladder procedure.  He went home and woke up and his leg started swelling and he didn't understand it.  He went to the doctor the next day.  Then he saw his PCP yesterday and she imaged him and found a blood clot.  His DVT US was this AM and then he was sent to the ER.  He was discharged on O2 after having needed it before.  He wasn't using it at home because he wasn't feeling exhausted.  He did feel SOB today and CT shows PE.  Assessment and plan  PE -Patient with recent hospitalization and diagnosis of bladder cancer presenting with initial DVT and PE -He appears comfortable on [place] O2 and without tachycardia/tachypnea -No evidence of right heart strain on CT, initial troponin negative Continue telemetry -we'll continue with heparin drip for another 24 hours -we'll switch him to Eliquis tomorrow, case management consult to see if he can afford it -Smoking cessation has been strongly encouraged   Bladder cancer -S/p recent cystoscopy with bladder biopsy and fulguration  -As per urology -Has f/u appointment to discuss results in 1-2 weeks  Tobacco dependence -Recently stopped smoking -The importance of ongoing cessation was discussed -He requests a patch      DVT prophylaxsis heparin drip  Code Status:  DO NOT RESUSCITATE   Family Communication: Discussed in detail with the patient, all imaging results, lab results explained to the patient   Disposition  Plan:  Anticipate discharge tomorrow    Consultants:   none  Procedures:  none   Antibiotics: Anti-infectives (From admission, onward)   Start     Dose/Rate Route Frequency Ordered Stop   03/20/18 1000  levofloxacin (LEVAQUIN) tablet 500 mg  Status:  Discontinued     500 mg Oral Daily 03/19/18 1721 03/19/18 1738         HPI/Subjective: Currently on 2 L of oxygen, denies any chest pain or shortness of breath  Objective: Vitals:   03/19/18 1545 03/19/18 1630 03/19/18 1719 03/20/18 0820  BP: (!) 155/88 (!) 168/90 (!) 151/83 (!) 143/78  Pulse: 85 95 84 91  Resp:   (!) 22 (!) 30  Temp:   (!) 97.4 F (36.3 C) 98.6 F (37 C)  TempSrc:   Oral Oral  SpO2: 91% 97% 96% 92%  Weight:      Height:        Intake/Output Summary (Last 24 hours) at 03/20/2018 0913 Last data filed at 03/20/2018 0700 Gross per 24 hour  Intake 559.43 ml  Output -  Net 559.43 ml    Exam:  Examination:  General exam: Appears calm and comfortable  Respiratory system: Clear to auscultation. Respiratory effort normal. Cardiovascular system: S1 & S2 heard, RRR. No JVD, murmurs, rubs, gallops or clicks. No pedal edema. Gastrointestinal system: Abdomen is nondistended, soft and nontender. No organomegaly or masses felt. Normal bowel sounds heard. Central nervous system: Alert and oriented. No focal neurological deficits. Extremities: Symmetric 5 x 5  power. Skin: No rashes, lesions or ulcers Psychiatry: Judgement and insight appear normal. Mood & affect appropriate.     Data Reviewed: I have personally reviewed following labs and imaging studies  Micro Results No results found for this or any previous visit (from the past 240 hour(s)).  Radiology Reports Dg Chest 2 View  Result Date: 03/18/2018 CLINICAL DATA:  Dyspnea EXAM: CHEST - 2 VIEW COMPARISON:  03/06/2018 chest radiograph. FINDINGS: Stable cardiomediastinal silhouette with normal heart size. No pneumothorax. No pleural effusion. No  pulmonary edema. No acute consolidative airspace disease. Stable mild scarring in the lingula, as seen on 11/09/2017 CT abdomen study. IMPRESSION: No active cardiopulmonary disease. Electronically Signed   By: Ilona Sorrel M.D.   On: 03/18/2018 13:23   Ct Angio Chest Pe W Or Wo Contrast  Result Date: 03/19/2018 CLINICAL DATA:  Right leg swelling, DVT EXAM: CT ANGIOGRAPHY CHEST WITH CONTRAST TECHNIQUE: Multidetector CT imaging of the chest was performed using the standard protocol during bolus administration of intravenous contrast. Multiplanar CT image reconstructions and MIPs were obtained to evaluate the vascular anatomy. CONTRAST:  159mL ISOVUE-370 IOPAMIDOL (ISOVUE-370) INJECTION 76% COMPARISON:  Chest radiographs dated 03/18/2018 FINDINGS: Cardiovascular: Satisfactory opacification the bilateral pulmonary arteries to the segmental level. However, there is respiratory motion which significantly constraints evaluation. Segmental right lower lobe pulmonary emboli (coronal image 102) are present. Possible subsegmental left lower lobe pulmonary emboli (series 6/image 165), although this appearance is less convincing and may be related to motion artifact. Overall clot burden is small. No evidence of right heart strain. No evidence of thoracic aortic aneurysm or dissection. Atherosclerotic calcifications of the aortic arch. The heart is normal in size.  No pericardial effusion. Three vessel coronary atherosclerosis. Mediastinum/Nodes: No suspicious mediastinal lymphadenopathy. Visualized thyroid is unremarkable. Lungs/Pleura: Evaluation lung parenchyma is constrained by respiratory motion. Within that constraint, there are no suspicious pulmonary nodules. Mild paraseptal emphysematous changes in the bilateral upper lobes. Mild scarring/atelectasis in the lingula, right middle lobe, and medial right lower lobe. No focal consolidation. No pleural effusion or pneumothorax. Upper Abdomen: Visualized upper abdomen is  grossly unremarkable. Musculoskeletal: Degenerative changes of the thoracic spine. Review of the MIP images confirms the above findings. IMPRESSION: Segmental/subsegmental pulmonary emboli in branches of the right lower lobe and possibly the left lower lobe, as described above. Overall clot burden is small. No evidence of right heart strain. Critical Value/emergent results were called by telephone at the time of interpretation on 03/19/2018 at 3:11 pm to Dr. Duffy Bruce , who verbally acknowledged these results. Aortic Atherosclerosis (ICD10-I70.0) and Emphysema (ICD10-J43.9). Electronically Signed   By: Julian Hy M.D.   On: 03/19/2018 15:13   Dg Chest Port 1 View  Result Date: 03/06/2018 CLINICAL DATA:  Hypoxia. EXAM: PORTABLE CHEST 1 VIEW COMPARISON:  10/07/2013 FINDINGS: The heart size and pulmonary vascularity are normal. No infiltrates or effusions. Tiny area of atelectasis or scarring at the left base laterally. Aortic atherosclerosis. No acute bone abnormality. Old deformity of the midshaft of the right clavicle. Old left rib fractures. IMPRESSION: Tiny area of atelectasis or scarring at the left lung base. Aortic Atherosclerosis (ICD10-I70.0). Electronically Signed   By: Lorriane Shire M.D.   On: 03/06/2018 16:33     CBC Recent Labs  Lab 03/18/18 1233 03/19/18 1213 03/20/18 0544  WBC 14.4* 13.2* 12.8*  HGB 16.3 15.5 15.0  HCT 47.9 49.3 48.3  PLT 334 312 298  MCV 88.4 92.5 93.6  MCH 30.1 29.1 29.1  MCHC 34.0 31.4 31.1  RDW 13.3 13.4 13.7  LYMPHSABS 3,125  --   --   EOSABS 403  --   --   BASOSABS 86  --   --     Chemistries  Recent Labs  Lab 03/18/18 1233 03/19/18 1213 03/20/18 0544  NA 140 139 139  K 4.6 4.6 4.6  CL 99 99 98  CO2 31 27 30   GLUCOSE 105* 99 89  BUN 11 9 9   CREATININE 0.89 0.92 0.93  CALCIUM 9.8 9.4 9.0  AST 17  --   --   ALT 14  --   --   BILITOT 0.5  --   --     ------------------------------------------------------------------------------------------------------------------ estimated creatinine clearance is 72.6 mL/min (by C-G formula based on SCr of 0.93 mg/dL). ------------------------------------------------------------------------------------------------------------------ No results for input(s): HGBA1C in the last 72 hours. ------------------------------------------------------------------------------------------------------------------ No results for input(s): CHOL, HDL, LDLCALC, TRIG, CHOLHDL, LDLDIRECT in the last 72 hours. ------------------------------------------------------------------------------------------------------------------ No results for input(s): TSH, T4TOTAL, T3FREE, THYROIDAB in the last 72 hours.  Invalid input(s): FREET3 ------------------------------------------------------------------------------------------------------------------ No results for input(s): VITAMINB12, FOLATE, FERRITIN, TIBC, IRON, RETICCTPCT in the last 72 hours.  Coagulation profile Recent Labs  Lab 03/20/18 0544  INR 1.09    No results for input(s): DDIMER in the last 72 hours.  Cardiac Enzymes Recent Labs  Lab 03/19/18 1213  TROPONINI <0.03   ------------------------------------------------------------------------------------------------------------------ Invalid input(s): POCBNP   CBG: No results for input(s): GLUCAP in the last 168 hours.     Studies: Dg Chest 2 View  Result Date: 03/18/2018 CLINICAL DATA:  Dyspnea EXAM: CHEST - 2 VIEW COMPARISON:  03/06/2018 chest radiograph. FINDINGS: Stable cardiomediastinal silhouette with normal heart size. No pneumothorax. No pleural effusion. No pulmonary edema. No acute consolidative airspace disease. Stable mild scarring in the lingula, as seen on 11/09/2017 CT abdomen study. IMPRESSION: No active cardiopulmonary disease. Electronically Signed   By: Ilona Sorrel M.D.   On: 03/18/2018  13:23   Ct Angio Chest Pe W Or Wo Contrast  Result Date: 03/19/2018 CLINICAL DATA:  Right leg swelling, DVT EXAM: CT ANGIOGRAPHY CHEST WITH CONTRAST TECHNIQUE: Multidetector CT imaging of the chest was performed using the standard protocol during bolus administration of intravenous contrast. Multiplanar CT image reconstructions and MIPs were obtained to evaluate the vascular anatomy. CONTRAST:  112mL ISOVUE-370 IOPAMIDOL (ISOVUE-370) INJECTION 76% COMPARISON:  Chest radiographs dated 03/18/2018 FINDINGS: Cardiovascular: Satisfactory opacification the bilateral pulmonary arteries to the segmental level. However, there is respiratory motion which significantly constraints evaluation. Segmental right lower lobe pulmonary emboli (coronal image 102) are present. Possible subsegmental left lower lobe pulmonary emboli (series 6/image 165), although this appearance is less convincing and may be related to motion artifact. Overall clot burden is small. No evidence of right heart strain. No evidence of thoracic aortic aneurysm or dissection. Atherosclerotic calcifications of the aortic arch. The heart is normal in size.  No pericardial effusion. Three vessel coronary atherosclerosis. Mediastinum/Nodes: No suspicious mediastinal lymphadenopathy. Visualized thyroid is unremarkable. Lungs/Pleura: Evaluation lung parenchyma is constrained by respiratory motion. Within that constraint, there are no suspicious pulmonary nodules. Mild paraseptal emphysematous changes in the bilateral upper lobes. Mild scarring/atelectasis in the lingula, right middle lobe, and medial right lower lobe. No focal consolidation. No pleural effusion or pneumothorax. Upper Abdomen: Visualized upper abdomen is grossly unremarkable. Musculoskeletal: Degenerative changes of the thoracic spine. Review of the MIP images confirms the above findings. IMPRESSION: Segmental/subsegmental pulmonary emboli in branches of the right lower lobe and possibly the left  lower lobe, as described above. Overall  clot burden is small. No evidence of right heart strain. Critical Value/emergent results were called by telephone at the time of interpretation on 03/19/2018 at 3:11 pm to Dr. Duffy Bruce , who verbally acknowledged these results. Aortic Atherosclerosis (ICD10-I70.0) and Emphysema (ICD10-J43.9). Electronically Signed   By: Julian Hy M.D.   On: 03/19/2018 15:13      No results found for: HGBA1C Lab Results  Component Value Date   LDLCALC 126 (H) 12/25/2017   CREATININE 0.93 03/20/2018       Scheduled Meds: . docusate sodium  100 mg Oral BID  . ketotifen  1 drop Both Eyes BID  . mouth rinse  15 mL Mouth Rinse BID  . nicotine  14 mg Transdermal Daily   Continuous Infusions: . heparin 1,250 Units/hr (03/20/18 4451)     LOS: 0 days    Time spent: >30 MINS    Reyne Dumas  [facility] Pager [phone]. If 7PM-7AM, please contact night-coverage at [email or web address], password [facility] 03/20/2018, 9:13 AM  LOS: 0 days

## 2018-03-20 NOTE — Care Management Note (Addendum)
Case Management Note  Patient Details  Name: Aaron Strong MRN: [id number] Date of Birth: 1951-08-26  Subjective/Objective:  From home with home oxygen with [employer], he says he uses it at night only,  presents with PE, DVT, will need to be on eliquis at discharge, NCM awaiting benefit check for eliquis. NCM gave patient the 30 day coupon for eliquis and his co pay for refills.                     Action/Plan: NCM will follow for dc needs.   Expected Discharge Date:                  Expected Discharge Plan:  Home/Self Care  In-House Referral:     Discharge planning Services  CM Consult  Post Acute Care Choice:    Choice offered to:     DME Arranged:    DME Agency:     HH Arranged:    HH Agency:     Status of Service:  In process, will continue to follow  If discussed at Long Length of Stay Meetings, dates discussed:    Additional Comments:  Zenon Mayo, RN 03/20/2018, 10:10 AM

## 2018-03-20 NOTE — Progress Notes (Signed)
#   4. [place] @ [place] # [id number]    1.ELIQUIS 2.5 MG BID  COVER- YES  CO-PAY- $ 45.00  TIER- 3 DRUG  PRIOR APPROVAL- NO    2. ELIQUIS 5 MG BID  COVER- YES  CO-PAY- $ 45.00  TIER - 3 DRUG  PRIOR APPROVAL- NO   PREFERRED PHARMACY : YES  [facility], [facility] , [facility] AND  [facility]

## 2018-03-21 DIAGNOSIS — F17200 Nicotine dependence, unspecified, uncomplicated: Secondary | ICD-10-CM | POA: Diagnosis not present

## 2018-03-21 DIAGNOSIS — I2699 Other pulmonary embolism without acute cor pulmonale: Secondary | ICD-10-CM | POA: Diagnosis not present

## 2018-03-21 DIAGNOSIS — I2602 Saddle embolus of pulmonary artery with acute cor pulmonale: Secondary | ICD-10-CM | POA: Diagnosis not present

## 2018-03-21 LAB — CBC
HCT: 46.8 % (ref 39.0–52.0)
Hemoglobin: 14.7 g/dL (ref 13.0–17.0)
MCH: 29.4 pg (ref 26.0–34.0)
MCHC: 31.4 g/dL (ref 30.0–36.0)
MCV: 93.6 fL (ref 78.0–100.0)
PLATELETS: 322 10*3/uL (ref 150–400)
RBC: 5 MIL/uL (ref 4.22–5.81)
RDW: 13.7 % (ref 11.5–15.5)
WBC: 13.4 10*3/uL — AB (ref 4.0–10.5)

## 2018-03-21 LAB — PROTIME-INR
INR: 1.02
Prothrombin Time: 13.3 seconds (ref 11.4–15.2)

## 2018-03-21 LAB — HEPARIN LEVEL (UNFRACTIONATED): Heparin Unfractionated: 0.52 IU/mL (ref 0.30–0.70)

## 2018-03-21 MED ORDER — ELIQUIS 5 MG VTE STARTER PACK: ORAL_TABLET | ORAL | 0 refills | Status: DC

## 2018-03-21 MED ORDER — NICOTINE 14 MG/24HR TD PT24
14.0000 mg | MEDICATED_PATCH | Freq: Every day | TRANSDERMAL | 0 refills | Status: DC
Start: 2018-03-21 — End: 2018-08-16

## 2018-03-21 MED ORDER — APIXABAN 5 MG PO TABS: 10.0000 mg | ORAL_TABLET | Freq: Two times a day (BID) | ORAL | Status: DC

## 2018-03-21 MED ORDER — APIXABAN 5 MG PO TABS
10.0000 mg | ORAL_TABLET | Freq: Two times a day (BID) | ORAL | Status: DC
  Administered 2018-03-21: 10 mg via ORAL
  Filled 2018-03-21: qty 2

## 2018-03-21 MED ORDER — APIXABAN 5 MG PO TABS: 5.0000 mg | ORAL_TABLET | Freq: Two times a day (BID) | ORAL | 2 refills | Status: DC

## 2018-03-21 MED ORDER — ONDANSETRON HCL 4 MG PO TABS: 4.0000 mg | ORAL_TABLET | Freq: Four times a day (QID) | ORAL | 0 refills | Status: DC | PRN

## 2018-03-21 MED ORDER — ONDANSETRON HCL 4 MG/2ML IJ SOLN: 4.0000 mg | Freq: Four times a day (QID) | INTRAMUSCULAR | 0 refills | Status: DC | PRN

## 2018-03-21 MED ORDER — APIXABAN 5 MG PO TABS: 5.0000 mg | ORAL_TABLET | Freq: Two times a day (BID) | ORAL | Status: DC

## 2018-03-21 MED ORDER — DOCUSATE SODIUM 100 MG PO CAPS: 100.0000 mg | ORAL_CAPSULE | Freq: Two times a day (BID) | ORAL | 0 refills | Status: DC

## 2018-03-21 NOTE — Progress Notes (Addendum)
ANTICOAGULATION CONSULT NOTE - Follow-Up Consult  Pharmacy Consult for Heparin Indication: pulmonary embolus and DVT  No Known Allergies  Patient Measurements: Height: 5\' 3"  (160 cm) Weight: 184 lb (83.5 kg) IBW/kg (Calculated) : 56.9 Heparin Dosing Weight: 74.8 kg  Vital Signs: Temp: 97.5 F (36.4 C) (07/10 2313) Temp Source: Oral (07/10 2313) BP: 142/73 (07/10 2313) Pulse Rate: 81 (07/10 2313)  Labs: Recent Labs    03/18/18 1233 03/19/18 1213 03/19/18 2222 03/20/18 0544 03/21/18 0340  HGB 16.3 15.5  --  15.0 14.7  HCT 47.9 49.3  --  48.3 46.8  PLT 334 312  --  298 322  LABPROT  --   --   --  14.0 13.3  INR  --   --   --  1.09 1.02  HEPARINUNFRC  --   --  0.55 0.52 0.52  CREATININE 0.89 0.92  --  0.93  --   TROPONINI  --  <0.03  --   --   --     Estimated Creatinine Clearance: 72.6 mL/min (by C-G formula based on SCr of 0.93 mg/dL).  Assessment: 67 year old male with a history of bladder cancer and recent TURP surgery presented to the ED with R-LE swelling and positive DVT study as outpatient.   New PE +DVT - no R-heart strain, small clot burden. Likely provoked in setting of recent surgery. Last heparin level therapeutic at 0.52. CBC stable.  Goal of Therapy:  Heparin level 0.3-0.7 units/ml Monitor platelets by anticoagulation protocol: Yes   Plan:  Continue heparin gtt at 1,250 units/hr Monitor daily heparin level, CBC, s/s of bleed F/U transition to Eliquis  ADDENDUM: Stop heparin gtt Start Eliquis 10mg  PO BID x 7 days, then start 5mg  PO BID Monitor CBC, s/s of bleed  Elenor Quinones, PharmD, BCPS Clinical Pharmacist Phone number [phone] 03/21/2018 8:06 AM

## 2018-03-21 NOTE — Progress Notes (Signed)
SATURATION QUALIFICATIONS: (This note is used to comply with regulatory documentation for home oxygen)  Patient Saturations on Room Air at Rest = 86%  Patient Saturations on Room Air while Ambulating = 85%  Patient Saturations on 4 Liters of oxygen while Ambulating = 92%  Please briefly explain why patient needs home oxygen:

## 2018-03-21 NOTE — Discharge Instructions (Signed)
Information on my medicine - ELIQUIS (apixaban)  Why was Eliquis prescribed for you? Eliquis was prescribed to treat blood clots that may have been found in the veins of your legs (deep vein thrombosis) or in your lungs (pulmonary embolism) and to reduce the risk of them occurring again.  What do You need to know about Eliquis ? The starting dose is 10 mg (two 5 mg tablets) taken TWICE daily for the FIRST SEVEN (7) DAYS, then on 7/18  the dose is reduced to ONE 5 mg tablet taken TWICE daily.  Eliquis may be taken with or without food.   Try to take the dose about the same time in the morning and in the evening. If you have difficulty swallowing the tablet whole please discuss with your pharmacist how to take the medication safely.  Take Eliquis exactly as prescribed and DO NOT stop taking Eliquis without talking to the doctor who prescribed the medication.  Stopping may increase your risk of developing a new blood clot.  Refill your prescription before you run out.  After discharge, you should have regular check-up appointments with your healthcare provider that is prescribing your Eliquis.    What do you do if you miss a dose? If a dose of ELIQUIS is not taken at the scheduled time, take it as soon as possible on the same day and twice-daily administration should be resumed. The dose should not be doubled to make up for a missed dose.  Important Safety Information A possible side effect of Eliquis is bleeding. You should call your healthcare provider right away if you experience any of the following: ? Bleeding from an injury or your nose that does not stop. ? Unusual colored urine (red or dark brown) or unusual colored stools (red or black). ? Unusual bruising for unknown reasons. ? A serious fall or if you hit your head (even if there is no bleeding).  Some medicines may interact with Eliquis and might increase your risk of bleeding or clotting while on Eliquis. To help avoid  this, consult your healthcare provider or pharmacist prior to using any new prescription or non-prescription medications, including herbals, vitamins, non-steroidal anti-inflammatory drugs (NSAIDs) and supplements.  This website has more information on Eliquis (apixaban): http://www.eliquis.com/eliquis/home

## 2018-03-21 NOTE — Discharge Summary (Signed)
Physician Discharge Summary  Aaron Strong MRN: [id number] DOB/AGE: 14-May-1951 67 y.o.  PCP: [name], MD   Admit date: 03/19/2018 Discharge date: 03/21/2018  Discharge Diagnoses:    Principal Problem:   Pulmonary embolism [employer]) Active Problems:   Smoking   Bladder cancer ([place])    Follow-up recommendations Follow-up with PCP in 3-5 days , including all  additional recommended appointments as below Follow-up CBC, CMP in 3-5 days       Allergies as of 03/21/2018   No Known Allergies     Medication List    TAKE these medications   docusate sodium 100 MG capsule Commonly known as:  COLACE Take 1 capsule (100 mg total) by mouth 2 (two) times daily.   ELIQUIS STARTER PACK 5 MG Tabs Take as directed on package: start with two-'5mg'$  tablets twice daily for 7 days. On day 8, switch to one-'5mg'$  tablet twice daily.   apixaban 5 MG Tabs tablet Commonly known as:  ELIQUIS Take 1 tablet (5 mg total) by mouth 2 (two) times daily. Start taking on:  03/29/2018   levofloxacin 500 MG tablet Commonly known as:  LEVAQUIN Take 1 tablet (500 mg total) by mouth daily.   nicotine 14 mg/24hr patch Commonly known as:  NICODERM CQ - dosed in mg/24 hours Place 1 patch (14 mg total) onto the skin daily.   ondansetron 4 MG tablet Commonly known as:  ZOFRAN Take 1 tablet (4 mg total) by mouth every 6 (six) hours as needed for nausea.   ondansetron 4 MG/2ML Soln injection Commonly known as:  ZOFRAN Inject 2 mLs (4 mg total) into the vein every 6 (six) hours as needed for nausea.        Discharge Condition:    Discharge Instructions Get Medicines reviewed and adjusted: Please take all your medications with you for your next visit with your Primary MD  Please request your Primary MD to go over all hospital tests and procedure/radiological results at the follow up, please ask your Primary MD to get all Hospital records sent to his/her office.  If you experience worsening of your  admission symptoms, develop shortness of breath, life threatening emergency, suicidal or homicidal thoughts you must seek medical attention immediately by calling 911 or calling your MD immediately  if symptoms less severe.  You must read complete instructions/literature along with all the possible adverse reactions/side effects for all the Medicines you take and that have been prescribed to you. Take any new Medicines after you have completely understood and accpet all the possible adverse reactions/side effects.   Do not drive when taking Pain medications.   Do not take more than prescribed Pain, Sleep and Anxiety Medications  Special Instructions: If you have smoked or chewed Tobacco  in the last 2 yrs please stop smoking, stop any regular Alcohol  and or any Recreational drug use.  Wear Seat belts while driving.  Please note  You were cared for by a hospitalist during your hospital stay. Once you are discharged, your primary care physician will handle any further medical issues. Please note that NO REFILLS for any discharge medications will be authorized once you are discharged, as it is imperative that you return to your primary care physician (or establish a relationship with a primary care physician if you do not have one) for your aftercare needs so that they can reassess your need for medications and monitor your lab values.     No Known Allergies    Disposition:  Discharge disposition: 01-Home or Self Care        Consults:      Significant Diagnostic Studies:  Dg Chest 2 View  Result Date: 03/18/2018 CLINICAL DATA:  Dyspnea EXAM: CHEST - 2 VIEW COMPARISON:  03/06/2018 chest radiograph. FINDINGS: Stable cardiomediastinal silhouette with normal heart size. No pneumothorax. No pleural effusion. No pulmonary edema. No acute consolidative airspace disease. Stable mild scarring in the lingula, as seen on 11/09/2017 CT abdomen study. IMPRESSION: No active cardiopulmonary  disease. Electronically Signed   By: Ilona Sorrel M.D.   On: 03/18/2018 13:23   Ct Angio Chest Pe W Or Wo Contrast  Result Date: 03/19/2018 CLINICAL DATA:  Right leg swelling, DVT EXAM: CT ANGIOGRAPHY CHEST WITH CONTRAST TECHNIQUE: Multidetector CT imaging of the chest was performed using the standard protocol during bolus administration of intravenous contrast. Multiplanar CT image reconstructions and MIPs were obtained to evaluate the vascular anatomy. CONTRAST:  17m ISOVUE-370 IOPAMIDOL (ISOVUE-370) INJECTION 76% COMPARISON:  Chest radiographs dated 03/18/2018 FINDINGS: Cardiovascular: Satisfactory opacification the bilateral pulmonary arteries to the segmental level. However, there is respiratory motion which significantly constraints evaluation. Segmental right lower lobe pulmonary emboli (coronal image 102) are present. Possible subsegmental left lower lobe pulmonary emboli (series 6/image 165), although this appearance is less convincing and may be related to motion artifact. Overall clot burden is small. No evidence of right heart strain. No evidence of thoracic aortic aneurysm or dissection. Atherosclerotic calcifications of the aortic arch. The heart is normal in size.  No pericardial effusion. Three vessel coronary atherosclerosis. Mediastinum/Nodes: No suspicious mediastinal lymphadenopathy. Visualized thyroid is unremarkable. Lungs/Pleura: Evaluation lung parenchyma is constrained by respiratory motion. Within that constraint, there are no suspicious pulmonary nodules. Mild paraseptal emphysematous changes in the bilateral upper lobes. Mild scarring/atelectasis in the lingula, right middle lobe, and medial right lower lobe. No focal consolidation. No pleural effusion or pneumothorax. Upper Abdomen: Visualized upper abdomen is grossly unremarkable. Musculoskeletal: Degenerative changes of the thoracic spine. Review of the MIP images confirms the above findings. IMPRESSION: Segmental/subsegmental  pulmonary emboli in branches of the right lower lobe and possibly the left lower lobe, as described above. Overall clot burden is small. No evidence of right heart strain. Critical Value/emergent results were called by telephone at the time of interpretation on 03/19/2018 at 3:11 pm to Dr. CDuffy Bruce, who verbally acknowledged these results. Aortic Atherosclerosis (ICD10-I70.0) and Emphysema (ICD10-J43.9). Electronically Signed   By: SJulian HyM.D.   On: 03/19/2018 15:13   Dg Chest Port 1 View  Result Date: 03/06/2018 CLINICAL DATA:  Hypoxia. EXAM: PORTABLE CHEST 1 VIEW COMPARISON:  10/07/2013 FINDINGS: The heart size and pulmonary vascularity are normal. No infiltrates or effusions. Tiny area of atelectasis or scarring at the left base laterally. Aortic atherosclerosis. No acute bone abnormality. Old deformity of the midshaft of the right clavicle. Old left rib fractures. IMPRESSION: Tiny area of atelectasis or scarring at the left lung base. Aortic Atherosclerosis (ICD10-I70.0). Electronically Signed   By: JLorriane ShireM.D.   On: 03/06/2018 16:33    echocardiogram  LV EF: 65% -   70%  ------------------------------------------------------------------- Indications:      Murmur 785.2.  ------------------------------------------------------------------- History:   PMH:  Bladder Cancer. Prostate Cancer.  Risk factors: Current tobacco use.  ------------------------------------------------------------------- Study Conclusions  - Left ventricle: The cavity size was normal. There was severe   focal basal hypertrophy of the septum. Systolic function was   vigorous. The estimated ejection fraction was in the range of 65%  to 70%. Wall motion was normal; there were no regional wall   motion abnormalities. Features are consistent with a pseudonormal   left ventricular filling pattern, with concomitant abnormal   relaxation and increased filling pressure (grade 2 diastolic    dysfunction). There was accelerated color flow Doppler signal   through left ventricular outflow tract (likely source of murmur). - Aortic valve: Mildly calcified annulus. Trileaflet; mildly   thickened, mildly calcified leaflets. There was trivial   regurgitation. - Aorta: Ascending aortic diameter: 39 mm (S). - Ascending aorta: The ascending aorta was mildly dilated. - Mitral valve: Moderately calcified annulus. There was trivial   regurgitation. - Left atrium: The atrium was mildly dilated. - Tricuspid valve: There was mild regurgitation. - Pulmonary arteries: Systolic pressure was mildly increased. PA   peak pressure: 40 mm Hg (S).       Filed Weights   03/19/18 1039  Weight: 83.5 kg (184 lb)     Microbiology: No results found for this or any previous visit (from the past 240 hour(s)).     Blood Culture No results found for: SDES, SPECREQUEST, CULT, REPTSTATUS    Labs: Results for orders placed or performed during the hospital encounter of 03/19/18 (from the past 48 hour(s))  CBC     Status: Abnormal   Collection Time: 03/19/18 12:13 PM  Result Value Ref Range   WBC 13.2 (H) 4.0 - 10.5 K/uL   RBC 5.33 4.22 - 5.81 MIL/uL   Hemoglobin 15.5 13.0 - 17.0 g/dL   HCT 49.3 39.0 - 52.0 %   MCV 92.5 78.0 - 100.0 fL   MCH 29.1 26.0 - 34.0 pg   MCHC 31.4 30.0 - 36.0 g/dL   RDW 13.4 11.5 - 15.5 %   Platelets 312 150 - 400 K/uL    Comment: Performed at [facility], [place] [address], [place], [place] [address]  Basic metabolic panel     Status: None   Collection Time: 03/19/18 12:13 PM  Result Value Ref Range   Sodium 139 135 - 145 mmol/L   Potassium 4.6 3.5 - 5.1 mmol/L   Chloride 99 98 - 111 mmol/L    Comment: Please note change in reference range.   CO2 27 22 - 32 mmol/L   Glucose, Bld 99 70 - 99 mg/dL    Comment: Please note change in reference range.   BUN 9 8 - 23 mg/dL    Comment: Please note change in reference range.   Creatinine, Ser 0.92 0.61 - 1.24  mg/dL   Calcium 9.4 8.9 - 10.3 mg/dL   GFR calc non Af Amer >60 >60 mL/min   GFR calc Af Amer >60 >60 mL/min    Comment: (NOTE) The eGFR has been calculated using the CKD EPI equation. This calculation has not been validated in all clinical situations. eGFR's persistently <60 mL/min signify possible Chronic Kidney Disease.    Anion gap 13 5 - 15    Comment: Performed at [facility] [address] [place], [place] [address]  Troponin I     Status: None   Collection Time: 03/19/18 12:13 PM  Result Value Ref Range   Troponin I <0.03 <0.03 ng/mL    Comment: Performed at [facility] [address], [place], [place] [address]  Brain natriuretic peptide     Status: None   Collection Time: 03/19/18 12:13 PM  Result Value Ref Range   B Natriuretic Peptide 50.7 0.0 - 100.0 pg/mL  Comment: Performed at [facility], [place] [address] [place], [place] [address]  Heparin level (unfractionated)     Status: None   Collection Time: 03/19/18 10:22 PM  Result Value Ref Range   Heparin Unfractionated 0.55 0.30 - 0.70 IU/mL    Comment: (NOTE) If heparin results are below expected values, and patient dosage has  been confirmed, suggest follow up testing of antithrombin III levels. Performed at [facility], [place] [address], [place], [place] [address]   Basic metabolic panel     Status: None   Collection Time: 03/20/18  5:44 AM  Result Value Ref Range   Sodium 139 135 - 145 mmol/L   Potassium 4.6 3.5 - 5.1 mmol/L   Chloride 98 98 - 111 mmol/L    Comment: Please note change in reference range.   CO2 30 22 - 32 mmol/L   Glucose, Bld 89 70 - 99 mg/dL    Comment: Please note change in reference range.   BUN 9 8 - 23 mg/dL    Comment: Please note change in reference range.   Creatinine, Ser 0.93 0.61 - 1.24 mg/dL   Calcium 9.0 8.9 - 10.3 mg/dL   GFR calc non Af Amer >60 >60 mL/min   GFR calc Af Amer >60 >60 mL/min    Comment: (NOTE) The eGFR has been calculated using  the CKD EPI equation. This calculation has not been validated in all clinical situations. eGFR's persistently <60 mL/min signify possible Chronic Kidney Disease.    Anion gap 11 5 - 15    Comment: Performed at [facility] [address], [place], [place] [address]  Protime-INR     Status: None   Collection Time: 03/20/18  5:44 AM  Result Value Ref Range   Prothrombin Time 14.0 11.4 - 15.2 seconds   INR 1.09     Comment: Performed at [facility] [address], [place], [place] [address]  CBC     Status: Abnormal   Collection Time: 03/20/18  5:44 AM  Result Value Ref Range   WBC 12.8 (H) 4.0 - 10.5 K/uL   RBC 5.16 4.22 - 5.81 MIL/uL   Hemoglobin 15.0 13.0 - 17.0 g/dL   HCT 48.3 39.0 - 52.0 %   MCV 93.6 78.0 - 100.0 fL   MCH 29.1 26.0 - 34.0 pg   MCHC 31.1 30.0 - 36.0 g/dL   RDW 13.7 11.5 - 15.5 %   Platelets 298 150 - 400 K/uL    Comment: Performed at [facility], [place] [address], [place], [place] [address]  Heparin level (unfractionated)     Status: None   Collection Time: 03/20/18  5:44 AM  Result Value Ref Range   Heparin Unfractionated 0.52 0.30 - 0.70 IU/mL    Comment: (NOTE) If heparin results are below expected values, and patient dosage has  been confirmed, suggest follow up testing of antithrombin III levels. Performed at [facility], [place] [address] [place], [place] [address]   Protime-INR     Status: None   Collection Time: 03/21/18  3:40 AM  Result Value Ref Range   Prothrombin Time 13.3 11.4 - 15.2 seconds   INR 1.02     Comment: Performed at [facility] [address], [place], [place] [address]  CBC     Status: Abnormal   Collection Time: 03/21/18  3:40 AM  Result Value Ref Range   WBC 13.4 (H) 4.0 -  10.5 K/uL   RBC 5.00 4.22 - 5.81 MIL/uL   Hemoglobin 14.7 13.0 - 17.0 g/dL   HCT 46.8 39.0 - 52.0 %   MCV 93.6 78.0 - 100.0 fL   MCH 29.4 26.0 - 34.0 pg   MCHC 31.4 30.0 - 36.0 g/dL   RDW 13.7 11.5 - 15.5 %    Platelets 322 150 - 400 K/uL    Comment: Performed at [facility], [place] [address], [place], [place] [address]  Heparin level (unfractionated)     Status: None   Collection Time: 03/21/18  3:40 AM  Result Value Ref Range   Heparin Unfractionated 0.52 0.30 - 0.70 IU/mL    Comment: (NOTE) If heparin results are below expected values, and patient dosage has  been confirmed, suggest follow up testing of antithrombin III levels. Performed at [facility], [place] [address] [place], [place] [address]      Lipid Panel     Component Value Date/Time   CHOL 187 12/25/2017 0948   TRIG 84 12/25/2017 0948   HDL 43 12/25/2017 0948   CHOLHDL 4.3 12/25/2017 0948   VLDL 20 09/26/2016 1133   LDLCALC 126 (H) 12/25/2017 0948     No results found for: HGBA1C   Lab Results  Component Value Date   LDLCALC 126 (H) 12/25/2017   CREATININE 0.93 03/20/2018     HPI :   67 y.o.malewith medical history significant ofprostate CA; ETOH dependence, remote; heavy tobacco dependence; CAD; and recent bladder CA (grade T1c urothelial carcinoma with muscle involvement s/p TURBT 01/16/18) presenting with RLE edema.He was discharged on 6/28 after bladder procedure. He went home and woke up and his leg started swelling and he didn't understand it. He went to the doctor the next day. Then he saw his PCP yesterday and he imaged him and found a blood clot. His venous Doppler US was on the day of admission and then he was sent to the ER. He was discharged on O2 after having needed it before. He wasn't using it at home because he wasn't feeling exhausted. He did feel SOB today and CT shows PE.   HOSPITAL COURSE:  PE -Patient with recent hospitalization and diagnosis of bladder cancer presenting with initial DVT and PE -CT showed segmental/subsegmental pulmonary embolism in the branches of the right lower lobe and possibly the left lower lobe, overall clot burden appeared to be small without any  evidence of right heart strain -He appears comfortable on [place] O2 and without tachycardia/tachypnea -initial troponin negative  telemetry uneventful -placed on heparin drip every 48 hours, switched to Eliquis Duration to be determined by PCP -Smoking cessation has been strongly encouraged, but minimum 3 months   Bladder cancer -S/p recent cystoscopy with bladder biopsy and fulguration  -As per urology -Has f/u appointment to discuss results in 1-2 weeks  Tobacco dependence -Recently stopped smoking -The importance of ongoing cessation was discussed -He requests a patch      Discharge Exam:   Blood pressure (!) 142/73, pulse 81, temperature (!) 97.5 F (36.4 C), temperature source Oral, resp. rate (!) 25, height '5\' 3"'$  (1.6 m), weight 83.5 kg (184 lb), SpO2 93 %.   General exam: Appears calm and comfortable  Respiratory system: Clear to auscultation. Respiratory effort normal. Cardiovascular system: S1 & S2 heard, RRR. No JVD, murmurs, rubs, gallops or clicks. No pedal edema. Gastrointestinal system: Abdomen is nondistended, soft and nontender. No organomegaly or masses felt. Normal bowel sounds heard. Central  nervous system: Alert and oriented. No focal neurological deficits. Extremities: Symmetric 5 x 5 power. Skin: No rashes, lesions or ulcers Psychiatry: Judgement and insight appear normal. Mood & affect appropriate       Signed: Reyne Dumas 03/21/2018, 8:04 AM      Time needed to  prevent discharge, discussed with the patient and family 35 minutes

## 2018-03-21 NOTE — Care Management Obs Status (Signed)
[facility] NOTIFICATION   Patient Details  Name: Aaron Strong MRN: [id number] Date of Birth: 05-Dec-1950   Medicare Observation Status Notification Given:       Zenon Mayo, RN 03/21/2018, 9:00 AM

## 2018-03-22 ENCOUNTER — Encounter: Payer: Self-pay | Admitting: Internal Medicine

## 2018-03-22 ENCOUNTER — Ambulatory Visit (INDEPENDENT_AMBULATORY_CARE_PROVIDER_SITE_OTHER): Payer: Medicare HMO | Admitting: Internal Medicine

## 2018-03-22 ENCOUNTER — Other Ambulatory Visit: Payer: Self-pay | Admitting: Internal Medicine

## 2018-03-22 VITALS — BP 120/80 | HR 80 | Temp 98.1°F | Wt 180.0 lb

## 2018-03-22 DIAGNOSIS — I82431 Acute embolism and thrombosis of right popliteal vein: Secondary | ICD-10-CM | POA: Diagnosis not present

## 2018-03-22 DIAGNOSIS — C679 Malignant neoplasm of bladder, unspecified: Secondary | ICD-10-CM | POA: Diagnosis not present

## 2018-03-22 DIAGNOSIS — J449 Chronic obstructive pulmonary disease, unspecified: Secondary | ICD-10-CM | POA: Diagnosis not present

## 2018-03-22 DIAGNOSIS — I2699 Other pulmonary embolism without acute cor pulmonale: Secondary | ICD-10-CM

## 2018-03-22 NOTE — Patient Instructions (Signed)
Continue with Eliquis daily.  Follow-up in 1 week.  Labs drawn and pending.  Be careful with situations that could lead to a fall or injury since you are on blood thinner.

## 2018-03-22 NOTE — Progress Notes (Signed)
   Subjective:    Patient ID: Aaron Strong, male    DOB: 1951/01/12, 67 y.o.   MRN: [id number]  HPI He was here for recent hospitalization follow up on July 8th. Was hospitalized June 26-28. At the time of his follow up visit July 8, there was concern for right lower extremity DVT.  A Doppler study was ordered for the following day and subsequently he was re-admitted on July 9 with right DVT and right lower lobe pulmonary emboli.  Despite having pulmonary emboli, his O2 saturation was good in the office on July 8 at 96%.  This was on room air.  No prior history of clotting issues.  On June 26, he underwent TURP of bladder tumor with instillation of Mitomycin C. Post  operatively, he had oxygen desaturation and was admitted for observation.  He was discharged June 28.  He was sent home on  home oxygen.  He was feeling much better by the time I saw him on July 8.  However he had erythema and increased warmth of the right lower extremity that day.  He had an elevated white blood cell count of 14,400.  C- met was normal with the exception of alkaline phosphatase of 125.    He recently was diagnosed with bladder cancer and is status post  prostatectomy for prostate cancer in 2015.  He has residual urinary incontinence and has to wear pads.  [name] is his Urologist.  He is a smoker.  Currently smoking about 1.25 packs/day.  He is now on Eliquis.  He was cautioned about injuries/falls that could lead to bleeding particularly if striking his head.  He rides a moped but not recently.  He does wear a helmet.  He is currently out of work.  I suggested he needs to stay out of work for 2 to 4 weeks.  He works as a Animator for [employer] Fortunately, he does not do much heavy labor anymore.  He does not climb ladders.  Says he basically empties trash.  Review of Systems no cough, no congestion, no fever, no chills.  No bleeding from bladder or bowels.     Objective:   Physical Exam Skin warm  and dry.  Nodes none.  The redness and warmth of the right lower extremity has resolved.  Cardiac exam regular rate and rhythm.  No lower extremity edema.       Assessment & Plan:  Right lower extremity DVT with right lower lobe pulmonary emboli now on Eliquis  History of bladder cancer being treated by [name]  Status post prostatectomy for prostate cancer  COPD-does not seem to be short of breath and does not really need home oxygen at this point time  Plan: CBC with differential to be drawn on Monday  as well as basic metabolic panel.  Note to be out of work at least 2 weeks and possibly 4 weeks. Follow-up  with me in one week.

## 2018-03-25 ENCOUNTER — Other Ambulatory Visit: Payer: Medicare HMO | Admitting: Internal Medicine

## 2018-03-25 DIAGNOSIS — I2699 Other pulmonary embolism without acute cor pulmonale: Secondary | ICD-10-CM | POA: Diagnosis not present

## 2018-03-25 LAB — CBC WITH DIFFERENTIAL/PLATELET
BASOS ABS: 77 {cells}/uL (ref 0–200)
Basophils Relative: 0.7 %
EOS ABS: 495 {cells}/uL (ref 15–500)
Eosinophils Relative: 4.5 %
HEMATOCRIT: 47.3 % (ref 38.5–50.0)
HEMOGLOBIN: 15.6 g/dL (ref 13.2–17.1)
LYMPHS ABS: 2629 {cells}/uL (ref 850–3900)
MCH: 29.4 pg (ref 27.0–33.0)
MCHC: 33 g/dL (ref 32.0–36.0)
MCV: 89.2 fL (ref 80.0–100.0)
MONOS PCT: 10 %
MPV: 10.2 fL (ref 7.5–12.5)
NEUTROS ABS: 6699 {cells}/uL (ref 1500–7800)
Neutrophils Relative %: 60.9 %
Platelets: 362 10*3/uL (ref 140–400)
RBC: 5.3 10*6/uL (ref 4.20–5.80)
RDW: 13.2 % (ref 11.0–15.0)
Total Lymphocyte: 23.9 %
WBC: 11 10*3/uL — ABNORMAL HIGH (ref 3.8–10.8)
WBCMIX: 1100 {cells}/uL — AB (ref 200–950)

## 2018-03-25 LAB — BASIC METABOLIC PANEL
BUN: 8 mg/dL (ref 7–25)
CALCIUM: 9.6 mg/dL (ref 8.6–10.3)
CO2: 34 mmol/L — ABNORMAL HIGH (ref 20–32)
CREATININE: 0.8 mg/dL (ref 0.70–1.25)
Chloride: 97 mmol/L — ABNORMAL LOW (ref 98–110)
GLUCOSE: 92 mg/dL (ref 65–99)
Potassium: 5 mmol/L (ref 3.5–5.3)
Sodium: 137 mmol/L (ref 135–146)

## 2018-03-26 ENCOUNTER — Ambulatory Visit: Payer: Medicare HMO | Admitting: Internal Medicine

## 2018-03-27 ENCOUNTER — Encounter: Payer: Self-pay | Admitting: Internal Medicine

## 2018-03-27 DIAGNOSIS — Z8546 Personal history of malignant neoplasm of prostate: Secondary | ICD-10-CM | POA: Diagnosis not present

## 2018-03-29 ENCOUNTER — Ambulatory Visit (INDEPENDENT_AMBULATORY_CARE_PROVIDER_SITE_OTHER): Payer: Medicare HMO | Admitting: Internal Medicine

## 2018-03-29 VITALS — BP 110/70 | HR 103 | Temp 98.2°F | Ht 63.0 in | Wt 182.0 lb

## 2018-03-29 DIAGNOSIS — I82401 Acute embolism and thrombosis of unspecified deep veins of right lower extremity: Secondary | ICD-10-CM | POA: Diagnosis not present

## 2018-03-29 DIAGNOSIS — I2699 Other pulmonary embolism without acute cor pulmonale: Secondary | ICD-10-CM

## 2018-03-29 DIAGNOSIS — I82431 Acute embolism and thrombosis of right popliteal vein: Secondary | ICD-10-CM

## 2018-04-05 DIAGNOSIS — J449 Chronic obstructive pulmonary disease, unspecified: Secondary | ICD-10-CM

## 2018-04-05 DIAGNOSIS — I82492 Acute embolism and thrombosis of other specified deep vein of left lower extremity: Secondary | ICD-10-CM

## 2018-04-05 DIAGNOSIS — I2782 Chronic pulmonary embolism: Secondary | ICD-10-CM

## 2018-04-07 ENCOUNTER — Encounter: Payer: Self-pay | Admitting: Internal Medicine

## 2018-04-07 NOTE — Progress Notes (Signed)
   Subjective:    Patient ID: Aaron Strong, male    DOB: [birth year], 67 y.o.   MRN: [id number]  HPI Patient underwent TUR of bladder tumor with instillation of mitomycin on January 26.  Subsequently postoperatively developed hypoxia and was admitted.  Patient is a long-standing smoker.  Had negative chest x-ray postoperatively.  Responded well to oxygen and was sent home with home oxygen.  He returned here for follow-up on July 12.  Was found to have increased redness and warmth to right lower extremity.  Was sent for Doppler of the right lower extremity and was found to have a DVT.  Was subsequently sent to the emergency department was found to have segmental and subsegmental pulmonary emboli in branches of the right lower lobe and possibly left lower lobe.  Overall clot burden was small.  Patient was placed on anticoagulation.  He has been out of work since that time.    Review of Systems see above     Objective:   Physical Exam Pulse is 103 and regular.  Blood pressure 110/70 and pulse ox is 93% on room air.  He does not appear to be in any acute distress.  He says he feels well.  Wants to get back to work.  Says he is taking anticoagulation medication without difficulty. Says he does not need his home oxygen at present time Neck is supple.  Chest is clear to auscultation without rales or wheezing.  He does not appear to be dyspneic.  Cardiac exam regular rate and rhythm.  Extremities without edema.  No redness of the lower extremities or increased warmth.     Assessment & Plan:  Pulmonary emboli  DVT status post TUR  History of bladder cancer  History of prostate cancer  Plan: Patient says that his duties are light work and he may return to work next week but take it easy and not overexert himself.  He rides a moped to work.  He wears a helmet.  warned him about getting into accidents with the moped.  This is  risky.  He says it is a short distance from his home to his work.  I am  going to refer him to Dr. Beryle Beams for further evaluation.  I think he probably needs to be on anticoagulation for 6 months.

## 2018-04-07 NOTE — Patient Instructions (Addendum)
Continue anticoagulation as prescribed in the hospital.  We will contact home health to pick up your oxygen tank.  You may return to work but take it easy.  Be careful riding moped.

## 2018-04-17 DIAGNOSIS — Z5111 Encounter for antineoplastic chemotherapy: Secondary | ICD-10-CM | POA: Diagnosis not present

## 2018-04-17 DIAGNOSIS — C672 Malignant neoplasm of lateral wall of bladder: Secondary | ICD-10-CM | POA: Diagnosis not present

## 2018-04-24 DIAGNOSIS — Z5111 Encounter for antineoplastic chemotherapy: Secondary | ICD-10-CM | POA: Diagnosis not present

## 2018-04-24 DIAGNOSIS — C672 Malignant neoplasm of lateral wall of bladder: Secondary | ICD-10-CM | POA: Diagnosis not present

## 2018-05-01 DIAGNOSIS — C672 Malignant neoplasm of lateral wall of bladder: Secondary | ICD-10-CM | POA: Diagnosis not present

## 2018-05-01 DIAGNOSIS — Z5111 Encounter for antineoplastic chemotherapy: Secondary | ICD-10-CM | POA: Diagnosis not present

## 2018-05-08 DIAGNOSIS — Z5111 Encounter for antineoplastic chemotherapy: Secondary | ICD-10-CM | POA: Diagnosis not present

## 2018-05-08 DIAGNOSIS — C672 Malignant neoplasm of lateral wall of bladder: Secondary | ICD-10-CM | POA: Diagnosis not present

## 2018-05-15 DIAGNOSIS — Z5111 Encounter for antineoplastic chemotherapy: Secondary | ICD-10-CM | POA: Diagnosis not present

## 2018-05-15 DIAGNOSIS — C672 Malignant neoplasm of lateral wall of bladder: Secondary | ICD-10-CM | POA: Diagnosis not present

## 2018-05-22 DIAGNOSIS — Z5111 Encounter for antineoplastic chemotherapy: Secondary | ICD-10-CM | POA: Diagnosis not present

## 2018-05-22 DIAGNOSIS — C672 Malignant neoplasm of lateral wall of bladder: Secondary | ICD-10-CM | POA: Diagnosis not present

## 2018-06-06 DIAGNOSIS — C61 Malignant neoplasm of prostate: Secondary | ICD-10-CM | POA: Diagnosis not present

## 2018-06-06 DIAGNOSIS — C672 Malignant neoplasm of lateral wall of bladder: Secondary | ICD-10-CM | POA: Diagnosis not present

## 2018-07-10 ENCOUNTER — Ambulatory Visit (INDEPENDENT_AMBULATORY_CARE_PROVIDER_SITE_OTHER): Payer: Medicare HMO | Admitting: Internal Medicine

## 2018-07-10 ENCOUNTER — Encounter: Payer: Self-pay | Admitting: Internal Medicine

## 2018-07-10 VITALS — BP 122/70 | HR 99 | Temp 98.0°F | Ht 63.0 in | Wt 191.0 lb

## 2018-07-10 DIAGNOSIS — L219 Seborrheic dermatitis, unspecified: Secondary | ICD-10-CM | POA: Diagnosis not present

## 2018-07-10 DIAGNOSIS — J449 Chronic obstructive pulmonary disease, unspecified: Secondary | ICD-10-CM | POA: Diagnosis not present

## 2018-07-10 DIAGNOSIS — M5431 Sciatica, right side: Secondary | ICD-10-CM | POA: Diagnosis not present

## 2018-07-10 MED ORDER — PREDNISONE 10 MG PO TABS: ORAL_TABLET | ORAL | 0 refills | Status: DC

## 2018-07-10 MED ORDER — GLYCOPYRROLATE-FORMOTEROL 9-4.8 MCG/ACT IN AERO: 2.0000 | INHALATION_SPRAY | Freq: Two times a day (BID) | RESPIRATORY_TRACT | 1 refills | Status: DC

## 2018-07-10 MED ORDER — KETOCONAZOLE 2 % EX SHAM: MEDICATED_SHAMPOO | CUTANEOUS | 99 refills | Status: DC

## 2018-07-10 MED ORDER — BUDESONIDE-FORMOTEROL FUMARATE 160-4.5 MCG/ACT IN AERO: 2.0000 | INHALATION_SPRAY | Freq: Two times a day (BID) | RESPIRATORY_TRACT | 3 refills | Status: DC

## 2018-07-10 MED ORDER — LEVOFLOXACIN 500 MG PO TABS: 500.0000 mg | ORAL_TABLET | Freq: Every day | ORAL | 0 refills | Status: DC

## 2018-07-10 NOTE — Progress Notes (Signed)
   Subjective:    Patient ID: Aaron Strong, male    DOB: [birth year], 67 y.o.   MRN: [id number]  HPI 67 year old Male for follow up of COPD. Has been having more SOB recently. Some discolored sputum production. Smoking about a ppd.  Heavy smoker for many years.  Also complaining of flakiness around his scalp and eyebrows.  Having pain in right buttock when he is walking.  Fell on ice last year.    Review of Systems no fever or chills.     Objective:   Physical Exam At rest his pulse oximetry is 92% but when he walks it drops to 85%.  He was given a nebulizer treatment with DuoNeb in the office.  Pulse oximetry improved to 95% but still continues to drop with exercise.  Skin warm and dry.  He has seborrhea about his face and scalp.  Pharynx clear.  Neck supple.  No carotid bruits.  Chest is clear to auscultation after coughing.  He has rhonchi prior to coughing.  These are bilateral.  Some end expiratory wheezing.  Straight leg raising is negative at 90 degrees bilaterally with normal reflexes and normal muscle strength in both lower extremities     Assessment & Plan:  Exacerbation of COPD  Severe COPD  Right sciatica  Seborrheic dermatitis  Plan: Nizoral shampoo 3 times a week to face and scalp for seborrheic dermatitis  Coupons for Symbicort 160/4.5  2 sprays p.o. every 12 hours.  Coupon for [employer]  2 sprays 4 times a day.  Correct way to use inhaler demonstrated to him 3 different times today.  Prednisone 10 mg (# 42 tablets) to take in tapering course as directed going from 60 mg to 0 mg over 12 days.  Levaquin 500 mg daily for 10 days.  The prednisone should also help his sciatica.  Inhalers should help his COPD.  He is thinking of purchasing an [employer] oxygen machine.  Advised to quit smoking.  Currently smoking about a pack a day.  25 minutes spent with patient.

## 2018-07-10 NOTE — Patient Instructions (Addendum)
Bevespi inhaler 2 sprays 4 times a day.  Symbicort inhaler 2 sprays every 12 hours.  Levaquin 500 mg by mouth daily for 10 days.  Prednisone and tapering course over 12 days as directed going from 60 mg to 0 mg in tapering fashion.  Use Nizoral shampoo 3 times a week to face and scalp.

## 2018-07-17 ENCOUNTER — Other Ambulatory Visit: Payer: Self-pay | Admitting: Internal Medicine

## 2018-08-16 ENCOUNTER — Ambulatory Visit (INDEPENDENT_AMBULATORY_CARE_PROVIDER_SITE_OTHER): Payer: Medicare HMO | Admitting: Internal Medicine

## 2018-08-16 ENCOUNTER — Encounter: Payer: Self-pay | Admitting: Internal Medicine

## 2018-08-16 VITALS — BP 130/80 | HR 92 | Temp 98.1°F | Ht 63.0 in | Wt 189.0 lb

## 2018-08-16 DIAGNOSIS — J449 Chronic obstructive pulmonary disease, unspecified: Secondary | ICD-10-CM | POA: Diagnosis not present

## 2018-08-19 NOTE — Patient Instructions (Addendum)
Order placed for oxygen concentrator at 3 L nasal prong.

## 2018-08-19 NOTE — Progress Notes (Signed)
   Subjective:    Patient ID: Aaron Strong, male    DOB: 1950-10-16, 67 y.o.   MRN: [id number]  HPI He is here today to follow-up on COPD.  Continues to be short of breath off oxygen.  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  He is not doing much heavy labor.  On room air his O2 sat decreased to 87% with brisk walking at rest O2 sat was 90 to 92%.  On 3 L of oxygen O2 sats improved to 93% at rest and 90% with exercise.    Review of Systems see above     Objective:   Physical Exam Neck is supple without JVD thyromegaly or carotid bruits.  Chest clear to auscultation.  Cardiac exam regular rate and rhythm.  Extremities without edema.  He is alert and oriented x3.       Assessment & Plan:  Severe COPD  Plan: Continue Symbicort and albuterol inhalers as directed.  Order will be placed for oxygen concentrator.

## 2018-09-26 DIAGNOSIS — Z5111 Encounter for antineoplastic chemotherapy: Secondary | ICD-10-CM | POA: Diagnosis not present

## 2018-09-26 DIAGNOSIS — C672 Malignant neoplasm of lateral wall of bladder: Secondary | ICD-10-CM | POA: Diagnosis not present

## 2018-10-03 DIAGNOSIS — C672 Malignant neoplasm of lateral wall of bladder: Secondary | ICD-10-CM | POA: Diagnosis not present

## 2018-10-03 DIAGNOSIS — Z5111 Encounter for antineoplastic chemotherapy: Secondary | ICD-10-CM | POA: Diagnosis not present

## 2018-10-11 ENCOUNTER — Other Ambulatory Visit: Payer: Self-pay | Admitting: Internal Medicine

## 2018-10-28 DIAGNOSIS — C672 Malignant neoplasm of lateral wall of bladder: Secondary | ICD-10-CM | POA: Diagnosis not present

## 2018-10-28 DIAGNOSIS — Z8546 Personal history of malignant neoplasm of prostate: Secondary | ICD-10-CM | POA: Diagnosis not present

## 2018-11-20 ENCOUNTER — Telehealth: Payer: Self-pay

## 2018-11-20 NOTE — Telephone Encounter (Signed)
Patient called he had misplaced his Eliquis and took a dose of advil, he found his Eliquis 8 minutes after he took some advil. He called wanting to know if it was kay to his dose of Eliquis. Dr. Renold Genta said it was okay.

## 2019-01-13 ENCOUNTER — Other Ambulatory Visit: Payer: Self-pay | Admitting: Internal Medicine

## 2019-01-17 ENCOUNTER — Other Ambulatory Visit: Payer: Self-pay

## 2019-01-17 ENCOUNTER — Other Ambulatory Visit: Payer: Medicare HMO | Admitting: Internal Medicine

## 2019-01-17 DIAGNOSIS — J449 Chronic obstructive pulmonary disease, unspecified: Secondary | ICD-10-CM

## 2019-01-17 DIAGNOSIS — E7800 Pure hypercholesterolemia, unspecified: Secondary | ICD-10-CM | POA: Diagnosis not present

## 2019-01-17 DIAGNOSIS — I2699 Other pulmonary embolism without acute cor pulmonale: Secondary | ICD-10-CM

## 2019-01-17 DIAGNOSIS — C679 Malignant neoplasm of bladder, unspecified: Secondary | ICD-10-CM | POA: Diagnosis not present

## 2019-01-17 DIAGNOSIS — Z87891 Personal history of nicotine dependence: Secondary | ICD-10-CM | POA: Diagnosis not present

## 2019-01-17 DIAGNOSIS — I82431 Acute embolism and thrombosis of right popliteal vein: Secondary | ICD-10-CM | POA: Diagnosis not present

## 2019-01-17 DIAGNOSIS — Z8546 Personal history of malignant neoplasm of prostate: Secondary | ICD-10-CM | POA: Diagnosis not present

## 2019-01-17 DIAGNOSIS — Z9981 Dependence on supplemental oxygen: Secondary | ICD-10-CM | POA: Diagnosis not present

## 2019-01-17 DIAGNOSIS — Z9079 Acquired absence of other genital organ(s): Secondary | ICD-10-CM | POA: Diagnosis not present

## 2019-01-18 LAB — COMPLETE METABOLIC PANEL WITH GFR
AG Ratio: 1.4 (calc) (ref 1.0–2.5)
ALT: 10 U/L (ref 9–46)
AST: 18 U/L (ref 10–35)
Albumin: 4.2 g/dL (ref 3.6–5.1)
Alkaline phosphatase (APISO): 109 U/L (ref 35–144)
BUN: 9 mg/dL (ref 7–25)
CO2: 30 mmol/L (ref 20–32)
Calcium: 9.5 mg/dL (ref 8.6–10.3)
Chloride: 96 mmol/L — ABNORMAL LOW (ref 98–110)
Creat: 0.79 mg/dL (ref 0.70–1.25)
GFR, Est African American: 106 mL/min/{1.73_m2} (ref >=60)
GFR, Est Non African American: 92 mL/min/{1.73_m2} (ref >=60)
Globulin: 2.9 g/dL (calc) (ref 1.9–3.7)
Glucose, Bld: 88 mg/dL (ref 65–99)
Potassium: 5.1 mmol/L (ref 3.5–5.3)
Sodium: 136 mmol/L (ref 135–146)
Total Bilirubin: 0.5 mg/dL (ref 0.2–1.2)
Total Protein: 7.1 g/dL (ref 6.1–8.1)

## 2019-01-18 LAB — CBC WITH DIFFERENTIAL/PLATELET
Absolute Monocytes: 1061 cells/uL — ABNORMAL HIGH (ref 200–950)
Basophils Absolute: 62 cells/uL (ref 0–200)
Basophils Relative: 0.6 %
Eosinophils Absolute: 385 cells/uL (ref 15–500)
Eosinophils Relative: 3.7 %
HCT: 50 % (ref 38.5–50.0)
Hemoglobin: 17 g/dL (ref 13.2–17.1)
Lymphs Abs: 2506 cells/uL (ref 850–3900)
MCH: 29.9 pg (ref 27.0–33.0)
MCHC: 34 g/dL (ref 32.0–36.0)
MCV: 87.9 fL (ref 80.0–100.0)
MPV: 10.7 fL (ref 7.5–12.5)
Monocytes Relative: 10.2 %
Neutro Abs: 6386 cells/uL (ref 1500–7800)
Neutrophils Relative %: 61.4 %
Platelets: 326 10*3/uL (ref 140–400)
RBC: 5.69 10*6/uL (ref 4.20–5.80)
RDW: 14.1 % (ref 11.0–15.0)
Total Lymphocyte: 24.1 %
WBC: 10.4 10*3/uL (ref 3.8–10.8)

## 2019-01-18 LAB — LIPID PANEL
Cholesterol: 187 mg/dL (ref <200)
HDL: 44 mg/dL (ref >=40)
LDL Cholesterol (Calc): 124 mg/dL (calc) — ABNORMAL HIGH
Non-HDL Cholesterol (Calc): 143 mg/dL (calc) — ABNORMAL HIGH (ref <130)
Total CHOL/HDL Ratio: 4.3 (calc) (ref <5.0)
Triglycerides: 90 mg/dL (ref <150)

## 2019-01-18 LAB — PSA: PSA: <0.1 ng/mL (ref <=4.0)

## 2019-01-21 ENCOUNTER — Encounter: Payer: Self-pay | Admitting: Internal Medicine

## 2019-01-21 ENCOUNTER — Other Ambulatory Visit: Payer: Self-pay

## 2019-01-21 ENCOUNTER — Ambulatory Visit (INDEPENDENT_AMBULATORY_CARE_PROVIDER_SITE_OTHER): Payer: Medicare HMO | Admitting: Internal Medicine

## 2019-01-21 VITALS — BP 130/80 | HR 113 | Temp 98.6°F | Ht 63.0 in | Wt 193.0 lb

## 2019-01-21 DIAGNOSIS — C679 Malignant neoplasm of bladder, unspecified: Secondary | ICD-10-CM | POA: Diagnosis not present

## 2019-01-21 DIAGNOSIS — Z9079 Acquired absence of other genital organ(s): Secondary | ICD-10-CM | POA: Diagnosis not present

## 2019-01-21 DIAGNOSIS — Z8546 Personal history of malignant neoplasm of prostate: Secondary | ICD-10-CM

## 2019-01-21 DIAGNOSIS — Z9981 Dependence on supplemental oxygen: Secondary | ICD-10-CM

## 2019-01-21 DIAGNOSIS — Z87891 Personal history of nicotine dependence: Secondary | ICD-10-CM

## 2019-01-21 DIAGNOSIS — J449 Chronic obstructive pulmonary disease, unspecified: Secondary | ICD-10-CM

## 2019-01-21 DIAGNOSIS — E7800 Pure hypercholesterolemia, unspecified: Secondary | ICD-10-CM

## 2019-01-21 DIAGNOSIS — Z0000 Encounter for general adult medical examination without abnormal findings: Secondary | ICD-10-CM | POA: Diagnosis not present

## 2019-01-21 LAB — POCT URINALYSIS DIPSTICK
Appearance: NEGATIVE
Bilirubin, UA: NEGATIVE
Blood, UA: NEGATIVE
Glucose, UA: NEGATIVE
Ketones, UA: NEGATIVE
Leukocytes, UA: NEGATIVE
Nitrite, UA: NEGATIVE
Odor: NEGATIVE
Protein, UA: NEGATIVE
Spec Grav, UA: 1.01 (ref 1.010–1.025)
Urobilinogen, UA: 0.2 E.U./dL
pH, UA: 6.5 (ref 5.0–8.0)

## 2019-01-21 MED ORDER — BUDESONIDE-FORMOTEROL FUMARATE 160-4.5 MCG/ACT IN AERO: 2.0000 | INHALATION_SPRAY | Freq: Two times a day (BID) | RESPIRATORY_TRACT | 3 refills | Status: AC

## 2019-01-21 MED ORDER — GLYCOPYRROLATE-FORMOTEROL 9-4.8 MCG/ACT IN AERO: 2.0000 | INHALATION_SPRAY | Freq: Two times a day (BID) | RESPIRATORY_TRACT | 0 refills | Status: AC

## 2019-01-21 NOTE — Progress Notes (Signed)
Subjective:    Patient ID: Aaron Strong, male    DOB: Mar 14, 1951, 68 y.o.   MRN: [id number]  HPI 68 year old male in today for health maintenance exam, Medicare wellness and evaluation of medical issues.  He did a Cologuard test in 2019.  He has a history of prostate cancer status post radical prostatectomy in 2015 by Dr. [name].  In September 2019 he was found to have a small papillary bladder lesion adjacent to the right ureteral orifice concerning for cancer on flexible cystoscopy in the office.  He continues to smoke and has a history of having smoking.  His PSA has been basically undetectable since his prostatectomy.  In May 2019 he had a bladder biopsy showing high-grade T1 urothelial carcinoma of the bladder.  He was treated with BCG.  He had no evidence of metastatic disease or upper tract disease based on CT urogram March 2019.  He is followed closely by urologist.  He has COPD and requires chronic oxygen therapy.  We helped get healed portable oxygen.  History of ventral hernia.  History of old 15% inferior endplate compression fracture at L1.  Residual urinary incontinence status post prostatectomy.  Continues to smoke about 1/4 packs a day.  Immunizations are up-to-date.  Had infected sebaceous cyst posterior neck treated here with incision and drainage August 2013.  Remote history of right clavicle fracture.  Head CT of the brain in 2006 after a fall which showed no evidence of skull fracture.  Evaluated for chest pain prior to prostate cancer surgery in 2015.  Had normal 2D echocardiogram with normal LV function and negative stress Myoview study.  Social history: Never married.  He used to drink alcohol heavily but quit completely just after prostatectomy.  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  He drives a moped.  He rents a house and has a roommate.  Family history: 2 brothers with history of heart attacks.  Father died in  his 11s of cancer-patient does not know what type.  Mother died of "old age "at age 35.  72 sisters.    Review of Systems urinary incontinence status post prostatectomy otherwise negative review of systems    Objective:   Physical Exam Vitals signs reviewed.  Constitutional:      Appearance: Normal appearance.  HENT:     Head: Normocephalic and atraumatic.     Right Ear: Tympanic membrane normal.     Left Ear: Tympanic membrane normal.     Nose: Nose normal.     Mouth/Throat:     Mouth: Mucous membranes are moist.     Pharynx: Oropharynx is clear.  Eyes:     General: No scleral icterus.       Right eye: No discharge.        Left eye: No discharge.     Extraocular Movements: Extraocular movements intact.     Conjunctiva/sclera: Conjunctivae normal.     Pupils: Pupils are equal, round, and reactive to light.  Neck:     Musculoskeletal: Neck supple.     Vascular: No carotid bruit.  Cardiovascular:     Rate and Rhythm: Normal rate and regular rhythm.     Heart sounds: Normal heart sounds. No murmur.  Pulmonary:     Effort: Pulmonary effort is normal. No respiratory distress.     Breath sounds: Normal breath sounds. No wheezing or rales.  Abdominal:     General: Bowel sounds are normal. There is no  distension.     Palpations: Abdomen is soft. There is no mass.     Tenderness: There is no abdominal tenderness. There is no right CVA tenderness, left CVA tenderness or rebound.  Genitourinary:    Comments: Status post prostatectomy.  Not examined.  Deferred to urology Musculoskeletal:     Right lower leg: No edema.     Left lower leg: No edema.  Lymphadenopathy:     Cervical: No cervical adenopathy.  Skin:    General: Skin is warm and dry.     Findings: No rash.  Neurological:     General: No focal deficit present.     Mental Status: He is alert and oriented to person, place, and time.  Psychiatric:        Mood and Affect: Mood normal.        Behavior: Behavior normal.         Thought Content: Thought content normal.        Judgment: Judgment normal.    BP 130/80  Weight 193 pounds  O2 sat 91 %      Assessment & Plan:  COPD-requires portable oxygen.  COPD is severe.  He really has issues affording inhalers.  I have given him samples previously but we did not have any today  Continues to smoke fairly heavily and unwilling to quit  Elevated LDL cholesterol-does not want to be on statin therapy  History of transitional cell bladder carcinoma treated at [facility] urology  History of radical prostatectomy for prostate cancer 2015  Plan: Return in 1 year or as needed.  We will try to find samples of inhalers for him.  Subjective:   Patient presents for Medicare Annual/Subsequent preventive examination.  Review Past Medical/Family/Social: See above   Risk Factors  Current exercise habits: He is very active at his work.  Limited by COPD. Dietary issues discussed: Low-fat low carbohydrate discussed but not sure he always follows that  Cardiac risk factors: Pure hypercholesterolemia and smoking, heart disease in 2 brothers  Depression Screen  (Note: if answer to either of the following is "Yes", a more complete depression screening is indicated)   Over the past two weeks, have you felt down, depressed or hopeless? No  Over the past two weeks, have you felt little interest or pleasure in doing things? No Have you lost interest or pleasure in daily life? No Do you often feel hopeless? No Do you cry easily over simple problems? No   Activities of Daily Living  In your present state of health, do you have any difficulty performing the following activities?:   Driving? No  Managing money? No  Feeding yourself? No  Getting from bed to chair? No  Climbing a flight of stairs? No  Preparing food and eating?: No  Bathing or showering? No  Getting dressed: No  Getting to the toilet? No  Using the toilet:No  Moving around from place to place: No  In  the past year have you fallen or had a near fall?:No  Are you sexually active? No  Do you have more than one partner? No   Hearing Difficulties: No  Do you often ask people to speak up or repeat themselves? No  Do you experience ringing or noises in your ears? No  Do you have difficulty understanding soft or whispered voices? No  Do you feel that you have a problem with memory? No Do you often misplace items? No    Home Safety:  Do you have  a smoke alarm at your residence? Yes Do you have grab bars in the bathroom?  None Do you have throw rugs in your house?  None   Cognitive Testing  Alert? Yes Normal Appearance?Yes  Oriented to person? Yes Place? Yes  Time? Yes  Recall of three objects? Yes  Can perform simple calculations? Yes  Displays appropriate judgment?Yes  Can read the correct time from a watch face?Yes   List the Names of Other Physician/Practitioners you currently use:  See referral list for the physicians patient is currently seeing.  Urologist   Review of Systems: See above   Objective:     General appearance: Appears stated age  Head: Normocephalic, without obvious abnormality, atraumatic  Eyes: conj clear, EOMi PEERLA  Ears: normal TM's and external ear canals both ears  Nose: Nares normal. Septum midline. Mucosa normal. No drainage or sinus tenderness.  Throat: lips, mucosa, and tongue normal; teeth and gums normal  Neck: no adenopathy, no carotid bruit, no JVD, supple, symmetrical, trachea midline and thyroid not enlarged, symmetric, no tenderness/mass/nodules  No CVA tenderness.  Lungs: clear to auscultation bilaterally  Breasts: normal appearance, no masses  Heart: regular rate and rhythm, S1, S2 normal, no murmur, click, rub or gallop  Abdomen: soft, non-tender; bowel sounds normal; no masses, no organomegaly  Musculoskeletal: ROM normal in all joints, no crepitus, no deformity, Normal muscle strengthen. Back  is symmetric, no curvature. Skin:  Skin color, texture, turgor normal. No rashes or lesions  Lymph nodes: Cervical, supraclavicular, and axillary nodes normal.  Neurologic: CN 2 -12 Normal, Normal symmetric reflexes. Normal coordination and gait  Psych: Alert & Oriented x 3, Mood appear stable.    Assessment:    Annual wellness medicare exam   Plan:    During the course of the visit the patient was educated and counseled about appropriate screening and preventive services including:        Patient Instructions (the written plan) was given to the patient.  Medicare Attestation  I have personally reviewed:  The patient's medical and social history  Their use of alcohol, tobacco or illicit drugs  Their current medications and supplements  The patient's functional ability including ADLs,fall risks, home safety risks, cognitive, and hearing and visual impairment  Diet and physical activities  Evidence for depression or mood disorders  The patient's weight, height, BMI, and visual acuity have been recorded in the chart. I have made referrals, counseling, and provided education to the patient based on review of the above and I have provided the patient with a written personalized care plan for preventive services.

## 2019-02-09 NOTE — Patient Instructions (Signed)
It was a pleasure to see you today.  Return in 1 year or as needed.  Continue follow-up with urologist

## 2019-03-11 ENCOUNTER — Encounter: Payer: Self-pay | Admitting: Internal Medicine

## 2019-03-11 ENCOUNTER — Telehealth (INDEPENDENT_AMBULATORY_CARE_PROVIDER_SITE_OTHER): Payer: Medicare HMO | Admitting: Internal Medicine

## 2019-03-11 DIAGNOSIS — C679 Malignant neoplasm of bladder, unspecified: Secondary | ICD-10-CM

## 2019-03-11 DIAGNOSIS — Z87891 Personal history of nicotine dependence: Secondary | ICD-10-CM

## 2019-03-12 DIAGNOSIS — C679 Malignant neoplasm of bladder, unspecified: Secondary | ICD-10-CM

## 2019-03-12 NOTE — Telephone Encounter (Signed)
Received call from [facility] paramedic regarding patient found deceased at [facility] where he was employed. Pt was a heavy smoker oxygen dependent with hx bladder and prostate cancer. Last seen here in May. Did not want to quit smoking. I will sign death certificate

## 2019-03-12 DEATH — deceased

## 2019-09-18 IMAGING — DX DG CHEST 1V PORT
1 series · 1 of 1 positions shown · non-contrast
Comparison: 10/07/2013

CLINICAL DATA: Hypoxia.

EXAM:
PORTABLE CHEST 1 VIEW

[chest ap]
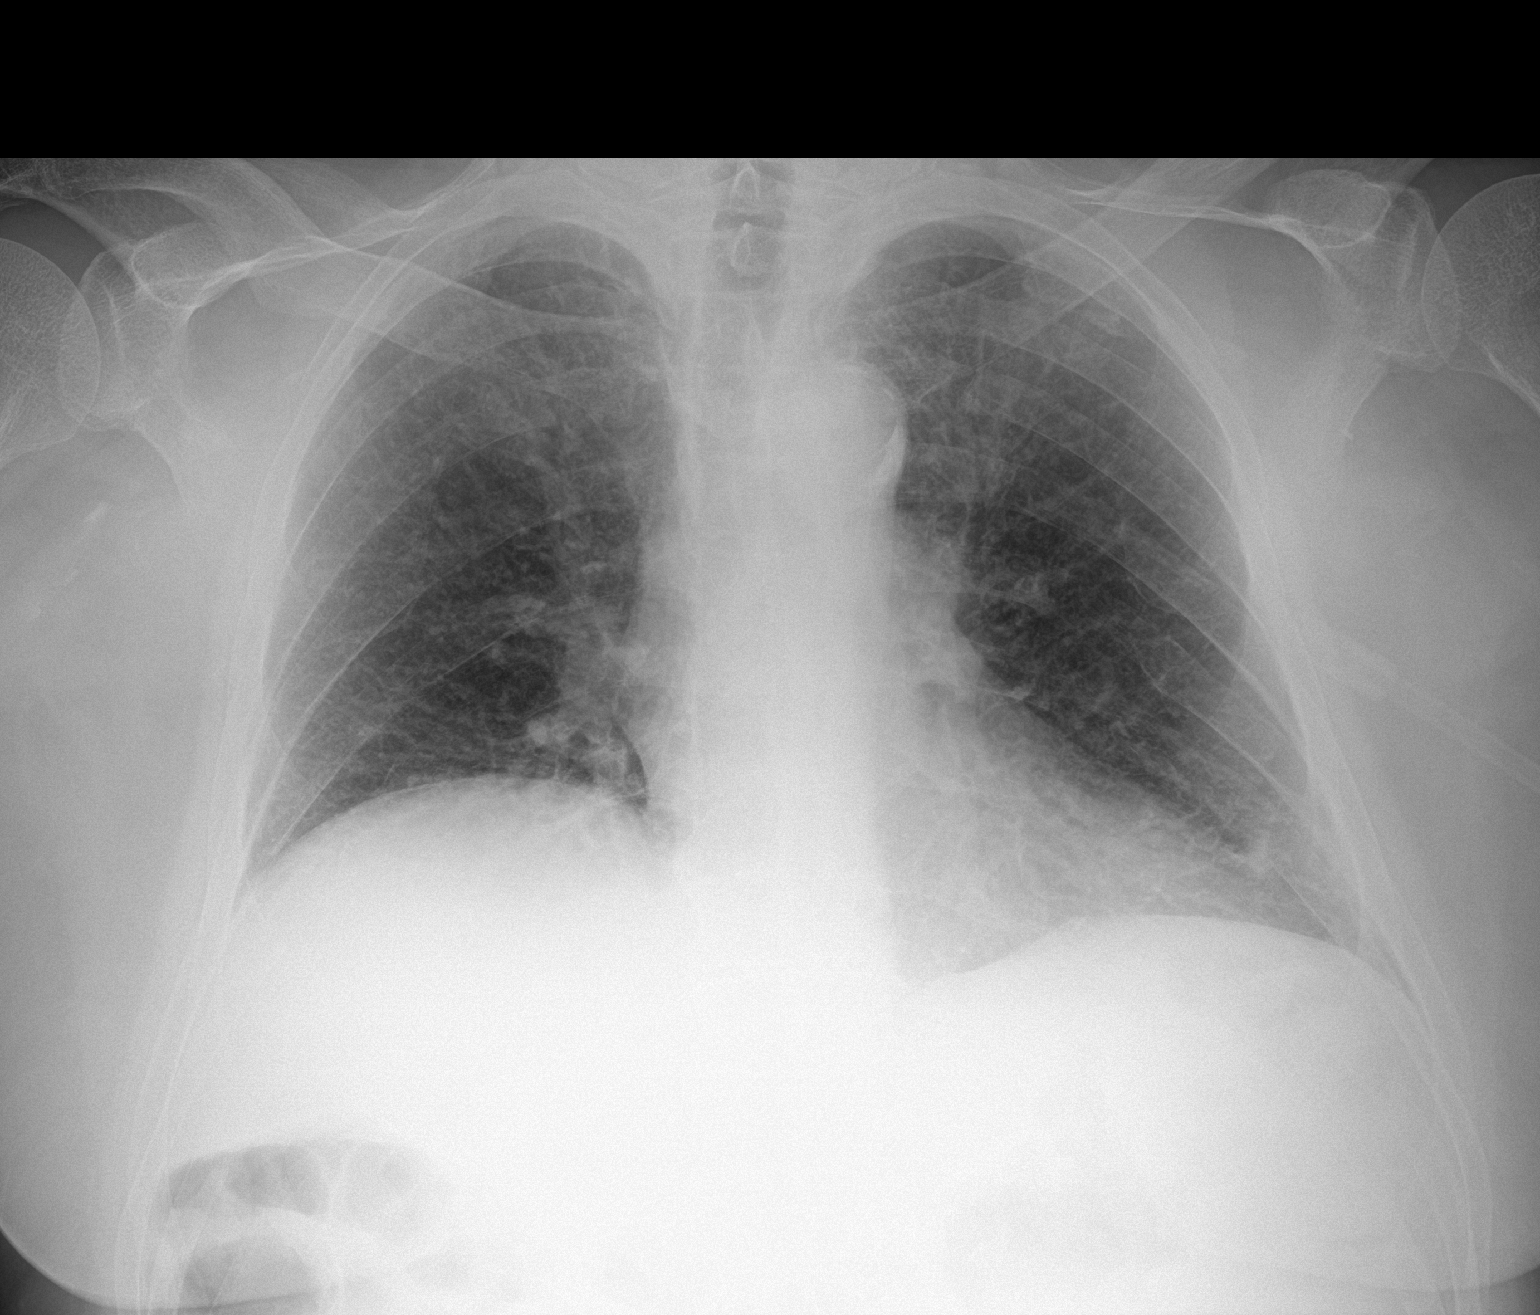

[1 of 1 positions shown; findings below may reference images not displayed]

FINDINGS: The heart size and pulmonary vascularity are normal. No infiltrates
or effusions. Tiny area of atelectasis or scarring at the left base
laterally. Aortic atherosclerosis. No acute bone abnormality. Old
deformity of the midshaft of the right clavicle. Old left rib
fractures.
IMPRESSION: Tiny area of atelectasis or scarring at the left lung base.

Aortic Atherosclerosis (5Y1XG-528.8).

## 2019-10-01 IMAGING — CT CT ANGIO CHEST
2 of 8 series · 17 of 46 positions shown · IV contrast (iopamidol)
Comparison: Chest radiographs dated 03/18/2018

CLINICAL DATA: Right leg swelling, DVT

EXAM:
CT ANGIOGRAPHY CHEST WITH CONTRAST
TECHNIQUE: Multidetector CT imaging of the chest was performed using the
standard protocol during bolus administration of intravenous
contrast. Multiplanar CT image reconstructions and MIPs were
obtained to evaluate the vascular anatomy.
CONTRAST:  100mL B3MD5Y-B2Q IOPAMIDOL (B3MD5Y-B2Q) INJECTION 76%

[Series 6: thins · axial · 0.72mm/px · z∈[+1091,+1333]mm · 14 of 268 slices shown]
[im 13/268  lung]
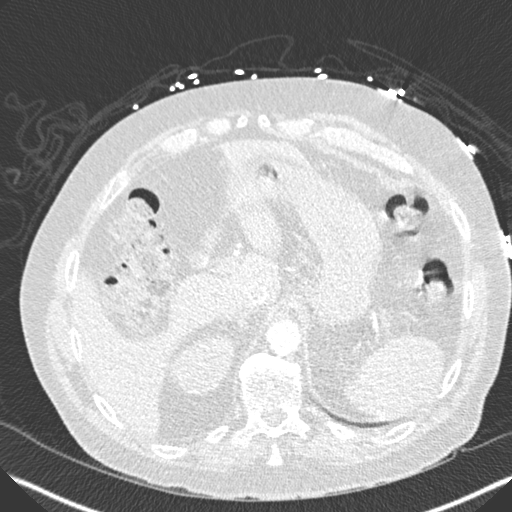
[im 37/268  soft-tissue]
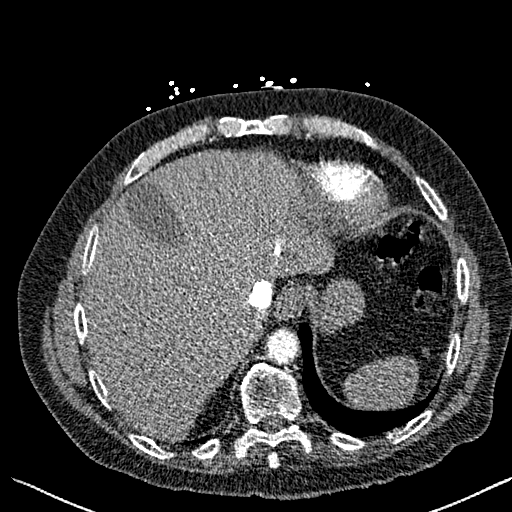
[im 49/268  lung]
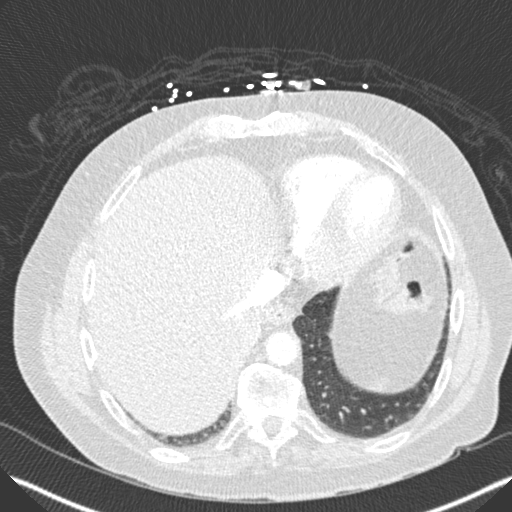
[im 73/268  soft-tissue]
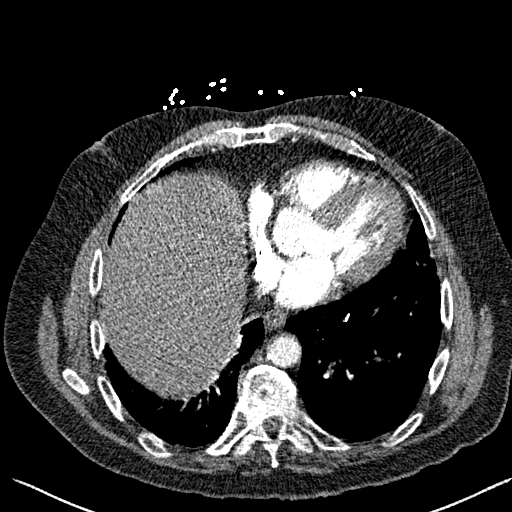
[im 85/268  lung]
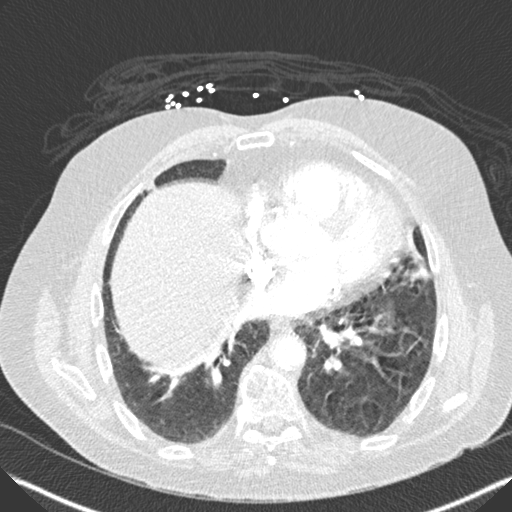
[im 110/268  soft-tissue]
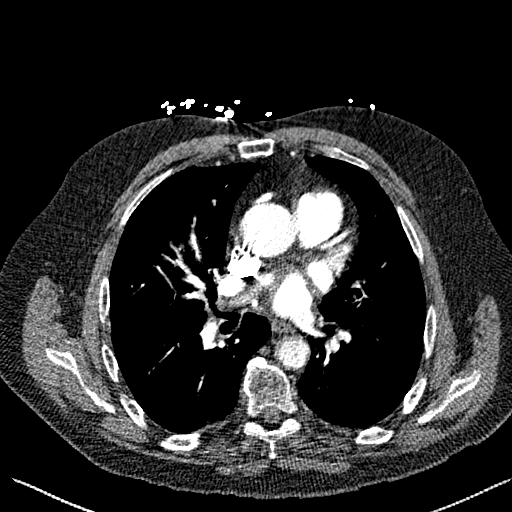
[im 122/268  lung]
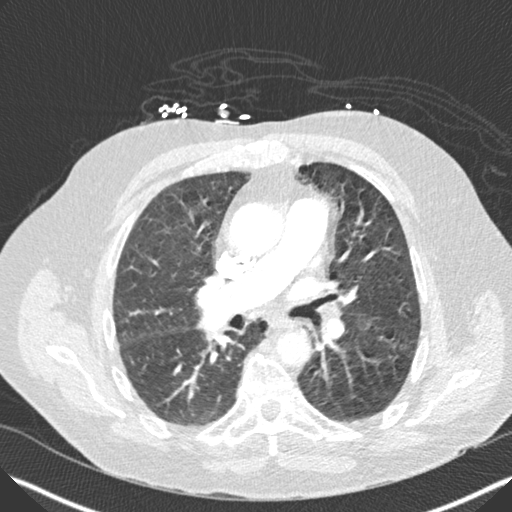
[im 146/268  soft-tissue]
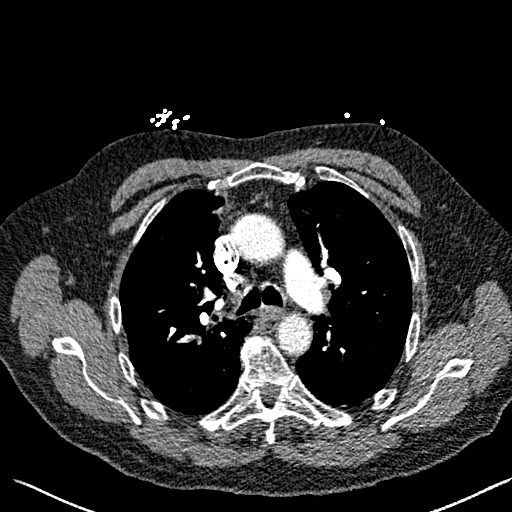
[im 158/268  lung]
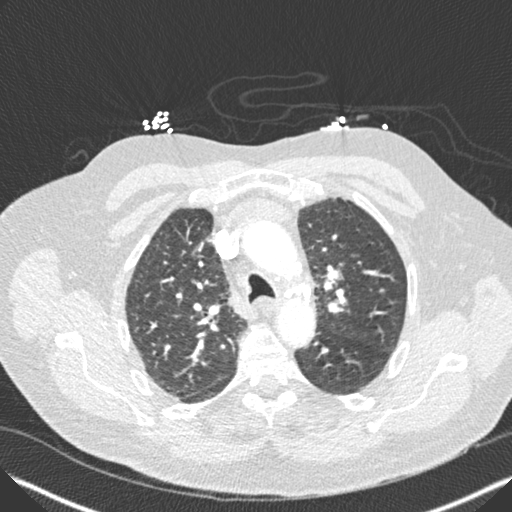
[im 183/268  soft-tissue]
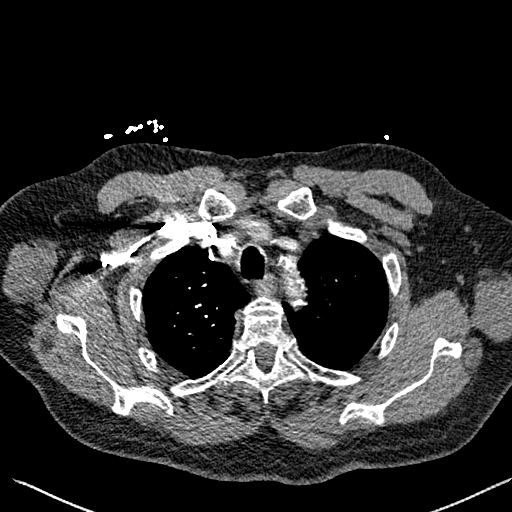
[im 195/268  lung]
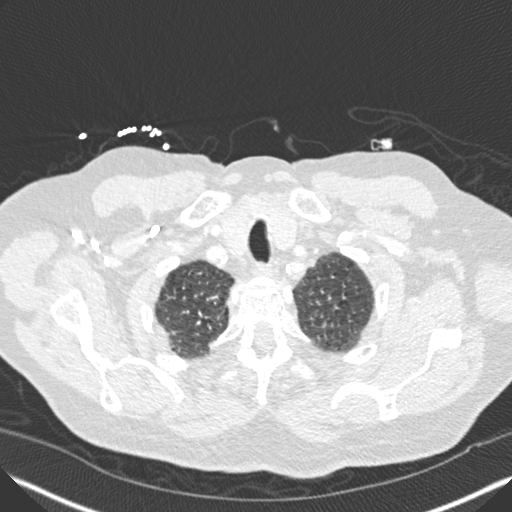
[im 219/268  soft-tissue]
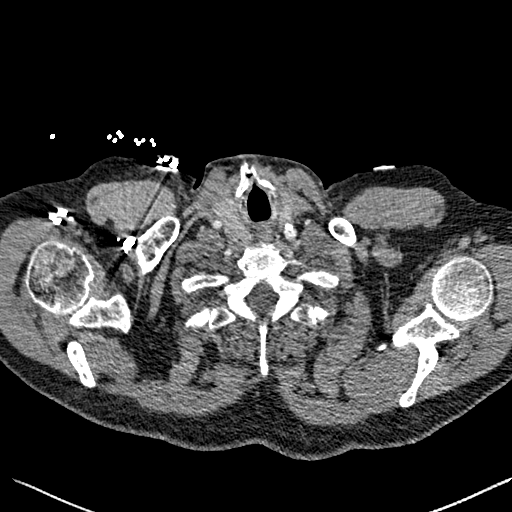
[im 231/268  lung]
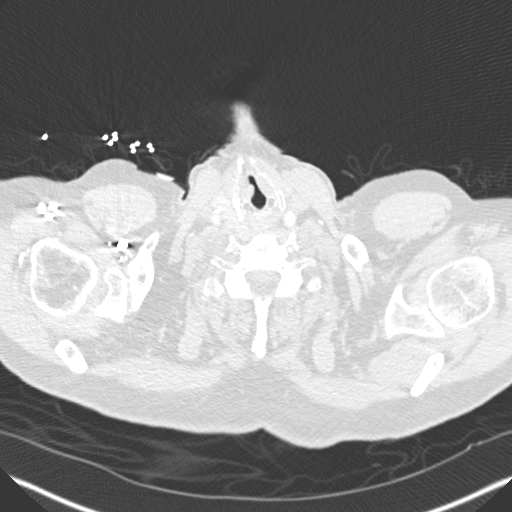
[im 255/268  soft-tissue]
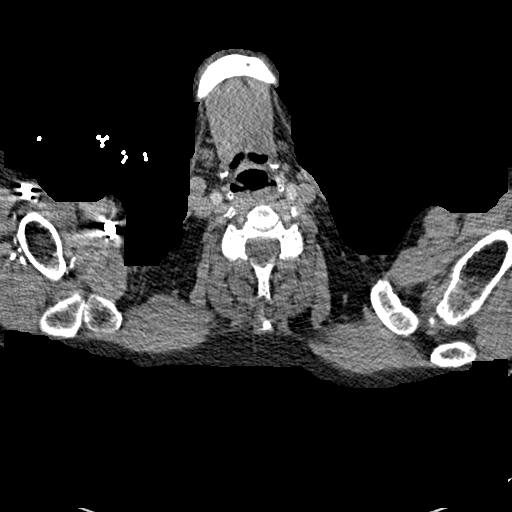

[Series 8: coronal mpr · coronal · 0.53mm/px · 3 of 151 slices shown]
[im 38/151  soft-tissue]
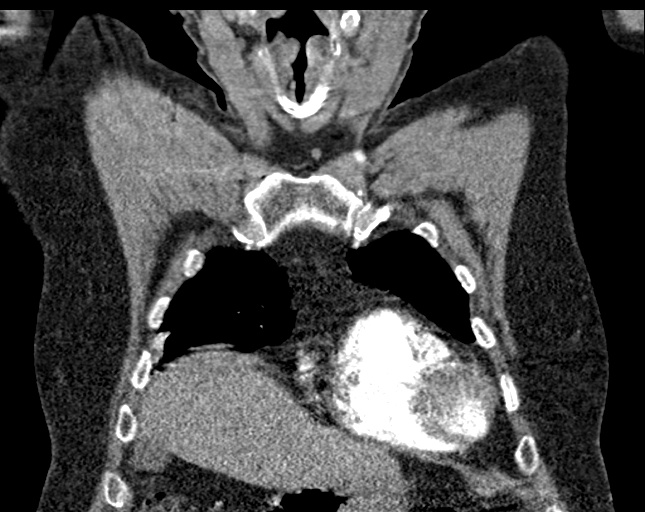
[im 76/151  soft-tissue]
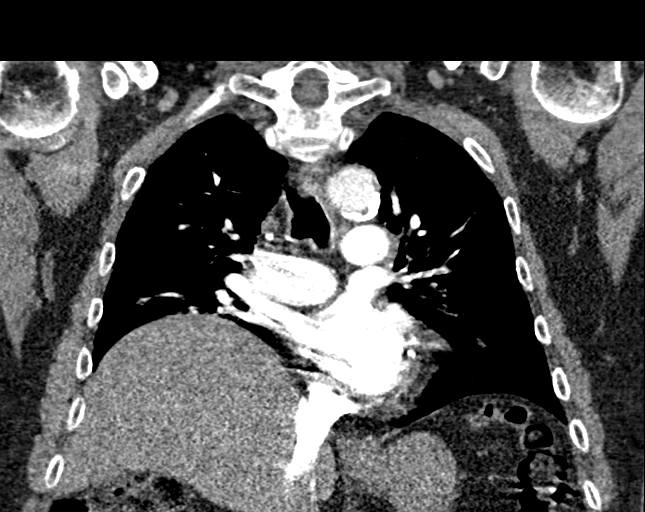
[im 113/151  soft-tissue]
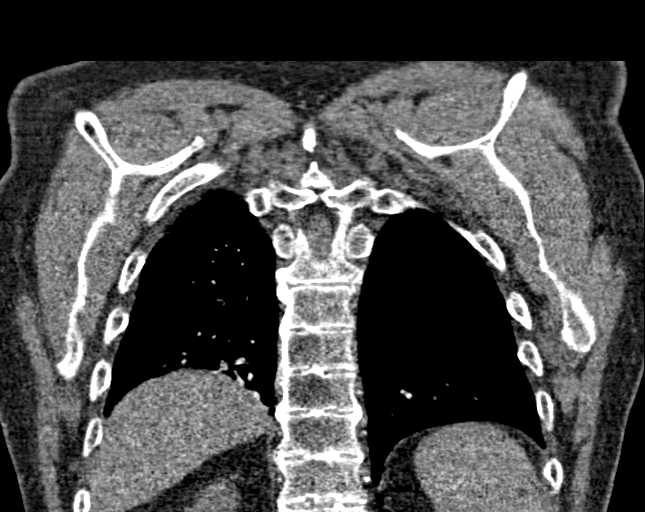

[17 of 46 positions shown; findings below may reference images not displayed]

FINDINGS: Cardiovascular: Satisfactory opacification the bilateral pulmonary
arteries to the segmental level. However, there is respiratory
motion which significantly constraints evaluation. Segmental right
lower lobe pulmonary emboli (coronal image 102) are present.
Possible subsegmental left lower lobe pulmonary emboli (series
6/image 165), although this appearance is less convincing and may be
related to motion artifact. Overall clot burden is small. No
evidence of right heart strain.

No evidence of thoracic aortic aneurysm or dissection.
Atherosclerotic calcifications of the aortic arch.

The heart is normal in size.  No pericardial effusion.

Three vessel coronary atherosclerosis.

Mediastinum/Nodes: No suspicious mediastinal lymphadenopathy.

Visualized thyroid is unremarkable.

Lungs/Pleura: Evaluation lung parenchyma is constrained by
respiratory motion.

Within that constraint, there are no suspicious pulmonary nodules.

Mild paraseptal emphysematous changes in the bilateral upper lobes.

Mild scarring/atelectasis in the lingula, right middle lobe, and
medial right lower lobe. No focal consolidation.

No pleural effusion or pneumothorax.

Upper Abdomen: Visualized upper abdomen is grossly unremarkable.

Musculoskeletal: Degenerative changes of the thoracic spine.

Review of the MIP images confirms the above findings.
IMPRESSION: Segmental/subsegmental pulmonary emboli in branches of the right
lower lobe and possibly the left lower lobe, as described above.
Overall clot burden is small. No evidence of right heart strain.

Critical Value/emergent results were called by telephone at the time
of interpretation on 03/19/2018 at [DATE] to Dr. MAKSIM SOTELO , who
verbally acknowledged these results.

Aortic Atherosclerosis (D3C81-SBI.I) and Emphysema (D3C81-U7E.K).
